# Patient Record
Sex: Female | Born: 1975 | Race: Black or African American | Hispanic: No | Marital: Single | Smoking: Former smoker
Health system: Southern US, Community
[De-identification: ages and names within clinical notes are randomized; demographics above are authoritative.]

## PROBLEM LIST (undated history)

## (undated) DIAGNOSIS — G4730 Sleep apnea, unspecified: Secondary | ICD-10-CM

## (undated) DIAGNOSIS — F909 Attention-deficit hyperactivity disorder, unspecified type: Secondary | ICD-10-CM

## (undated) DIAGNOSIS — F419 Anxiety disorder, unspecified: Secondary | ICD-10-CM

## (undated) DIAGNOSIS — R51 Headache: Secondary | ICD-10-CM

## (undated) DIAGNOSIS — R519 Headache, unspecified: Secondary | ICD-10-CM

## (undated) DIAGNOSIS — I499 Cardiac arrhythmia, unspecified: Secondary | ICD-10-CM

## (undated) DIAGNOSIS — Z9109 Other allergy status, other than to drugs and biological substances: Secondary | ICD-10-CM

## (undated) DIAGNOSIS — F32A Depression, unspecified: Secondary | ICD-10-CM

## (undated) DIAGNOSIS — G8929 Other chronic pain: Secondary | ICD-10-CM

## (undated) DIAGNOSIS — Z8614 Personal history of Methicillin resistant Staphylococcus aureus infection: Secondary | ICD-10-CM

## (undated) DIAGNOSIS — G5603 Carpal tunnel syndrome, bilateral upper limbs: Secondary | ICD-10-CM

## (undated) DIAGNOSIS — F329 Major depressive disorder, single episode, unspecified: Secondary | ICD-10-CM

## (undated) DIAGNOSIS — M654 Radial styloid tenosynovitis [de Quervain]: Secondary | ICD-10-CM

## (undated) HISTORY — PX: ABDOMINAL HYSTERECTOMY: SHX81

---

## 2006-09-01 ENCOUNTER — Emergency Department (HOSPITAL_COMMUNITY): Admission: EM | Admit: 2006-09-01 | Discharge: 2006-09-01 | Payer: Self-pay | Admitting: Emergency Medicine

## 2006-09-18 ENCOUNTER — Emergency Department (HOSPITAL_COMMUNITY): Admission: EM | Admit: 2006-09-18 | Discharge: 2006-09-18 | Payer: Self-pay | Admitting: Emergency Medicine

## 2006-10-02 ENCOUNTER — Ambulatory Visit: Payer: Self-pay | Admitting: Internal Medicine

## 2006-11-29 ENCOUNTER — Ambulatory Visit: Payer: Self-pay | Admitting: Hematology and Oncology

## 2006-11-29 ENCOUNTER — Inpatient Hospital Stay (HOSPITAL_COMMUNITY): Admission: EM | Admit: 2006-11-29 | Discharge: 2006-12-01 | Payer: Self-pay | Admitting: Emergency Medicine

## 2006-12-02 ENCOUNTER — Ambulatory Visit: Payer: Self-pay | Admitting: Hematology and Oncology

## 2006-12-16 LAB — CBC & DIFF AND RETIC
BASO%: 0.7 % (ref 0.0–2.0)
Eosinophils Absolute: 0.1 10*3/uL (ref 0.0–0.5)
LYMPH%: 20.1 % (ref 14.0–48.0)
MCH: 24.5 pg — ABNORMAL LOW (ref 26.0–34.0)
MCHC: 32 g/dL (ref 32.0–36.0)
RBC: 3.83 10*6/uL (ref 3.70–5.32)
RDW: 23.5 % — ABNORMAL HIGH (ref 11.3–14.5)
lymph#: 1 10*3/uL (ref 0.9–3.3)

## 2006-12-17 LAB — RETICULOCYTES (CHCC)
ABS Retic: 122.8 10*3/uL (ref 19.0–186.0)
RBC.: 3.96 MIL/uL (ref 3.87–5.11)

## 2006-12-29 LAB — PROTHROMBIN TIME
INR: 1.2 (ref 0.0–1.5)
Prothrombin Time: 15.4 seconds — ABNORMAL HIGH (ref 11.6–15.2)

## 2006-12-29 LAB — COMPREHENSIVE METABOLIC PANEL
AST: 12 U/L (ref 0–37)
Albumin: 4 g/dL (ref 3.5–5.2)
BUN: 9 mg/dL (ref 6–23)
CO2: 20 mEq/L (ref 19–32)
Calcium: 8.6 mg/dL (ref 8.4–10.5)
Chloride: 107 mEq/L (ref 96–112)
Creatinine, Ser: 0.65 mg/dL (ref 0.40–1.20)
Glucose, Bld: 99 mg/dL (ref 70–99)
Sodium: 140 mEq/L (ref 135–145)

## 2006-12-29 LAB — APTT: aPTT: 29 seconds (ref 24–37)

## 2006-12-29 LAB — FOLATE RBC: RBC Folate: 1104 ng/mL — ABNORMAL HIGH (ref 180–600)

## 2006-12-29 LAB — FERRITIN: Ferritin: 11 ng/mL (ref 10–291)

## 2006-12-29 LAB — DIRECT ANTIGLOBULIN TEST (NOT AT ARMC): DAT IgG: NEGATIVE

## 2006-12-29 LAB — VITAMIN B12: Vitamin B-12: 455 pg/mL (ref 211–911)

## 2006-12-29 LAB — IRON AND TIBC: UIBC: 439 ug/dL

## 2007-01-09 LAB — VON WILLEBRAND FACTOR MULTIMER: Von Willebrand Multimers: NORMAL

## 2007-01-19 ENCOUNTER — Ambulatory Visit: Payer: Self-pay | Admitting: Hematology and Oncology

## 2007-01-19 LAB — CBC WITH DIFFERENTIAL/PLATELET
Basophils Absolute: 0 10*3/uL (ref 0.0–0.1)
Eosinophils Absolute: 0.1 10*3/uL (ref 0.0–0.5)
HGB: 9.8 g/dL — ABNORMAL LOW (ref 11.6–15.9)
MCV: 73.6 fL — ABNORMAL LOW (ref 81.0–101.0)
MONO#: 0.3 10*3/uL (ref 0.1–0.9)
MONO%: 4.9 % (ref 0.0–13.0)
NEUT#: 3.8 10*3/uL (ref 1.5–6.5)
Platelets: 786 10*3/uL — ABNORMAL HIGH (ref 145–400)
RDW: 21.4 % — ABNORMAL HIGH (ref 11.3–14.5)
WBC: 5.3 10*3/uL (ref 3.9–10.0)

## 2007-01-19 LAB — BASIC METABOLIC PANEL
BUN: 8 mg/dL (ref 6–23)
CO2: 23 mEq/L (ref 19–32)
Glucose, Bld: 109 mg/dL — ABNORMAL HIGH (ref 70–99)
Potassium: 4.7 mEq/L (ref 3.5–5.3)

## 2007-01-19 LAB — IRON AND TIBC
%SAT: 3 % — ABNORMAL LOW (ref 20–55)
Iron: 14 ug/dL — ABNORMAL LOW (ref 42–145)

## 2007-01-19 LAB — FERRITIN: Ferritin: 9 ng/mL — ABNORMAL LOW (ref 10–291)

## 2007-02-05 DIAGNOSIS — Z8614 Personal history of Methicillin resistant Staphylococcus aureus infection: Secondary | ICD-10-CM

## 2007-02-05 HISTORY — DX: Personal history of Methicillin resistant Staphylococcus aureus infection: Z86.14

## 2007-03-05 ENCOUNTER — Ambulatory Visit: Payer: Self-pay | Admitting: Hematology and Oncology

## 2007-03-09 LAB — CBC WITH DIFFERENTIAL/PLATELET
Basophils Absolute: 0 10*3/uL (ref 0.0–0.1)
Eosinophils Absolute: 0.1 10*3/uL (ref 0.0–0.5)
MCH: 25.7 pg — ABNORMAL LOW (ref 26.0–34.0)
MCHC: 33.3 g/dL (ref 32.0–36.0)
MCV: 77.2 fL — ABNORMAL LOW (ref 81.0–101.0)
MONO#: 0.3 10*3/uL (ref 0.1–0.9)
MONO%: 8 % (ref 0.0–13.0)
NEUT#: 2.3 10*3/uL (ref 1.5–6.5)
NEUT%: 61.8 % (ref 39.6–76.8)
RDW: 31.3 % — ABNORMAL HIGH (ref 11.3–14.5)
WBC: 3.7 10*3/uL — ABNORMAL LOW (ref 3.9–10.0)
lymph#: 1.1 10*3/uL (ref 0.9–3.3)

## 2007-03-09 LAB — COMPREHENSIVE METABOLIC PANEL
Albumin: 3.8 g/dL (ref 3.5–5.2)
Alkaline Phosphatase: 27 U/L — ABNORMAL LOW (ref 39–117)
BUN: 8 mg/dL (ref 6–23)
CO2: 24 mEq/L (ref 19–32)
Glucose, Bld: 116 mg/dL — ABNORMAL HIGH (ref 70–99)
Potassium: 4.1 mEq/L (ref 3.5–5.3)

## 2007-03-09 LAB — IRON AND TIBC
%SAT: 27 % (ref 20–55)
Iron: 79 ug/dL (ref 42–145)
TIBC: 295 ug/dL (ref 250–470)
UIBC: 216 ug/dL

## 2007-05-01 ENCOUNTER — Emergency Department (HOSPITAL_COMMUNITY): Admission: EM | Admit: 2007-05-01 | Discharge: 2007-05-01 | Payer: Self-pay | Admitting: Emergency Medicine

## 2007-06-05 ENCOUNTER — Ambulatory Visit: Payer: Self-pay | Admitting: Hematology and Oncology

## 2007-06-09 LAB — CBC WITH DIFFERENTIAL/PLATELET
Basophils Absolute: 0 10*3/uL (ref 0.0–0.1)
EOS%: 1.8 % (ref 0.0–7.0)
Eosinophils Absolute: 0.1 10*3/uL (ref 0.0–0.5)
HCT: 40 % (ref 34.8–46.6)
HGB: 14.1 g/dL (ref 11.6–15.9)
LYMPH%: 32.2 % (ref 14.0–48.0)
NEUT#: 2.9 10*3/uL (ref 1.5–6.5)
NEUT%: 61.7 % (ref 39.6–76.8)

## 2007-09-15 ENCOUNTER — Emergency Department (HOSPITAL_COMMUNITY): Admission: EM | Admit: 2007-09-15 | Discharge: 2007-09-16 | Payer: Self-pay | Admitting: *Deleted

## 2007-10-05 ENCOUNTER — Emergency Department (HOSPITAL_COMMUNITY): Admission: EM | Admit: 2007-10-05 | Discharge: 2007-10-06 | Payer: Self-pay | Admitting: Emergency Medicine

## 2007-12-03 ENCOUNTER — Ambulatory Visit: Payer: Self-pay | Admitting: Hematology and Oncology

## 2008-02-27 ENCOUNTER — Emergency Department (HOSPITAL_COMMUNITY): Admission: EM | Admit: 2008-02-27 | Discharge: 2008-02-27 | Payer: Self-pay | Admitting: Emergency Medicine

## 2008-07-07 ENCOUNTER — Encounter: Admission: RE | Admit: 2008-07-07 | Discharge: 2008-07-07 | Payer: Self-pay | Admitting: Internal Medicine

## 2008-09-02 ENCOUNTER — Emergency Department (HOSPITAL_COMMUNITY): Admission: EM | Admit: 2008-09-02 | Discharge: 2008-09-02 | Payer: Self-pay | Admitting: Emergency Medicine

## 2010-05-13 LAB — DIFFERENTIAL
Basophils Relative: 0 % (ref 0–1)
Eosinophils Absolute: 0.1 10*3/uL (ref 0.0–0.7)
Eosinophils Relative: 2 % (ref 0–5)
Lymphocytes Relative: 28 % (ref 12–46)
Lymphs Abs: 1.6 10*3/uL (ref 0.7–4.0)
Neutro Abs: 3.7 10*3/uL (ref 1.7–7.7)

## 2010-05-13 LAB — CBC
HCT: 39.3 % (ref 36.0–46.0)
Hemoglobin: 13.2 g/dL (ref 12.0–15.0)
MCHC: 33.5 g/dL (ref 30.0–36.0)
Platelets: 152 10*3/uL (ref 150–400)
RBC: 4.53 MIL/uL (ref 3.87–5.11)
WBC: 5.7 10*3/uL (ref 4.0–10.5)

## 2010-06-19 NOTE — Consult Note (Signed)
NAMECATHRYN, [name]              ACCOUNT NO.:  [id number]   MEDICAL RECORD NO.:  [id number]          PATIENT TYPE:  INP   LOCATION:  1438                         FACILITY:  [facility]   PHYSICIAN:  Lauretta I. Odogwu, M.D.DATE OF BIRTH:  [birth year]   DATE OF CONSULTATION:  DATE OF DISCHARGE:                                 CONSULTATION   REASON FOR CONSULTATION:  Anemia and thrombocytosis.   FINDINGS:  The patient is a 35 year old woman who presented to the  emergency room at [facility], with a history of fatigue over a  several-day period.  She had initially been seen at an Urgent Care  Center, was notified that her hemoglobin was low and thus presented to  the emergency room to be evaluated.  The patient describes heavy menses  of 6 months' duration.  Her menses in the last 6 months had been every 2  weeks, lasting a week and with heavy flow.  She was seen by a  gynecologist in [facility] and was placed on birth control pills.  A  pelvic exam in May of 2008, was presumably within normal limits per  patient.  She was evaluated by a hematologist years ago for iron-  deficiency anemia secondary to menorrhagia.  She was placed on iron but  was not able to tolerate p.o. iron, and was subsequently received IV  iron in the form of Dextran.  She also reports receiving a bone marrow  biopsy a few years ago.  Reports are not available for review.  The  patient denies any other evidence of bleeding, specifically hematemesis,  hematuria, melena and hematochezia.  She denies abdominal pain and  weight loss.   PAST MEDICAL HISTORY:  1. History of iron-deficiency anemia.  2. History of bipolar disorder.   MEDICATIONS:  Depakote, Wellbutrin, Seasonale birth control pills.   ALLERGIES:  DARVOCET, PERCOCET.   SOCIAL HISTORY:  The patient is single.  She has four children.  She has  smoked half a pack of cigarettes a day, since she was 35 year old.  She  denies alcohol use.  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   FAMILY HISTORY:  Denies a family history of bleeding amd anemia.   REVIEW OF SYSTEMS:  As above. in addition:- Admits to shortness of  breath with mild exertion, admits to fatigue.  Denies significant weight  loss, fever or chills, nausea, vomiting or diarrhea.  She denies chest  pain, palpitations.  Rest of review of system unremarkable.   PHYSICAL EXAMINATION:  GENERAL:  The patient is alert and oriented x3.  VITALS:  Pulse 97, blood pressure on 112/61, temperature 98.2,  respirations 16, sats 96% on room air.  HEENT:  Head is atraumatic and normocephalic.  Extraocular muscles  intact.  Sclerae is anicteric.  Pupils equal, round and reactive to  light.  Mouth moist without ulcerations, thrush, lesions.  NECK:  Supple without thyromegaly.  CHEST:  Demonstrates good air entry bilaterally, clear to auscultation.  CARDIOVASCULAR:  Unremarkable.  ABDOMEN:  Soft, nontender.  Bowel sounds present.  EXTREMITIES:  Demonstrate no  edema.  Pulses present and symmetrical.  LYMPH NODES:  No no palpable cervical, axillary or inguinal adenopathy.  EXTREMITIES:  Demonstrate no edema.  Pulses present and symmetrical.  CNS AND PNS:  No focal deficit.   LABORATORY DATA:  White cell count 5.1, hemoglobin 3.6, hematocrit 12.4,  platelets 1,000,201, MCV 61.3.  Sodium 140, potassium 2.7, chloride 109, CO2 24, BUN 28, creatinine 0.6,  glucose 83.   Review of peripheral smear demonstrates a few giant platelets.  The red  blood cell were hypochromic and microcytic.  There were no shistocytes.   IMPRESSION AND PLAN:  A 35 year old woman presenting with:  Severe iron deficiency anemia with likely reactive thrombocytosis.  The  patient has been treated in the past with p.o. iron, which she was not  able to tolerate.  She subsequently required IV iron.  She also states  she has had a bone marrow biopsy.  She was seen by hematologist at [facility].  I will  need to obtain those records.  She is currently receiving  blood transfusion.  Once this is completed, I would suggest obtaining  iron studies to include ferritin, TIBC, iron, B12 with folic acid, retic  count, serum protein pheresis, LDH, a Von Willebrand panel, factor 8  activity level and hemoglobin electrophoresis.  She will also have an  abdominal ultrasound, to assess the size of liver and spleen.  I agree  with low-dose aspirin for the time being.  She needs to re-establish  follow up with her gynecologist. She will set up a follow-up visit with  hematology, when she is discharged.  Thank you for this consult.      Lauretta I. Odogwu, M.D.  Electronically Signed     LIO/MEDQ  D:  11/29/2006  T:  11/30/2006  Job:  [id number]   cc:   Silvestre Moment  Fax: [phone]

## 2010-06-19 NOTE — H&P (Signed)
[name], [name]              ACCOUNT NO.:  [id number]   MEDICAL RECORD NO.:  [id number]          PATIENT TYPE:  INP   LOCATION:  1438                         FACILITY:  [facility]   PHYSICIAN:  Della Goo, M.D. DATE OF BIRTH:  [birth year]   DATE OF ADMISSION:  11/29/2006  DATE OF DISCHARGE:                              HISTORY & PHYSICAL   CHIEF COMPLAINT:  Abnormal labs.   HISTORY OF PRESENT ILLNESS:  This is a 35 year old female who received a  call from her primary care physician to report to the emergency  department secondary to abnormal lab findings; a hemoglobin level of  3.7.  The patient reports having malaise, fatigue, weakness for months,  and having heavy menses that occur bimonthly.  She reports she has had  heavy menses for approximately 6 months.  She denies having any  hematemesis, hematochezia or melena.  The patient does report having  chronic headaches.   PAST MEDICAL HISTORY:  Significant for anemia, bipolar disorder,  thrombocytosis of which the patient had been seeing a hem/oncologist in  2002, and reports having a bone marrow biopsy performed at that time.  The patient reports seeing a Dr. Gypsy Lore in [place], [place].   MEDICATIONS:  1. Depakote 1000 mg 1 p.o. q day.  2. Wellbutrin 300 mg 1 p.o. day.   ALLERGIES:  DARVOCET AND PERCOCET, which both cause swelling.   SOCIAL HISTORY:  The patient is home with her 2 children.  Positive  tobacco history, half-a-pack per day.  Denies any alcohol usage or  illicit drug usage.   PAST SURGICAL HISTORY:  History of a C-section x1.   FAMILY HISTORY:  Mother with depression.  Father, no medical problems.  Positive hypertension and diabetes mellitus in her maternal grandmother.  Positive cancer in her paternal grandmother, who had lung and bone  cancer.   REVIEW OF SYSTEMS:  Pertinents are mentioned above.   PHYSICAL EXAMINATION:  This is a 35 year old well-nourished, well-  developed  female in no acute distress.  She is currently hemodynamically  stable.  Her temperature is 98.4, blood pressure 124/52, heart rate 105,  respirations 18, O2 saturations 100%.  HEENT EXAMINATION:  Normocephalic, atraumatic.  There is no scleral  icterus.  Pupils equally round, reactive to light.  Extraocular muscles  are intact.  Funduscopic benign.  Oropharynx is clear.  Mucosa moist.  There are no exudates.  NECK:  Supple.  Full range of motion.  No thyromegaly, adenopathy, or  jugular venous distention.  CARDIOVASCULAR:  Mild tachycardia.  No murmurs, gallops or rubs.  LUNGS:  Clear to auscultation bilaterally.  ABDOMEN:  Positive bowel sounds, soft, nontender, nondistended.  EXTREMITIES:  Without cyanosis, clubbing or edema.  NEUROLOGIC EXAMINATION:  Alert and oriented x3.  There are no focal  deficits.   LABORATORY STUDIES:  White blood cell count 5.1, hemoglobin 3.6,  hematocrit 12.4, platelets 1201, MCV 61.3.  Sodium 140, potassium 3.7,  chloride 109 bicarbonate 24, BUN 2, creatinine 0.60 and glucose 83.   ASSESSMENT:  35 year old female being admitted with:  1. Severe microcytic anemia.  2. Menorrhagia.  3. Myelodysplastic syndrome/thrombocytosis.  4. Tobacco abuse.  5. History of bipolar disorder.   PLAN:  The patient will be transfused packed red blood cells to increase  her hemoglobin.  She will continue on her regular medications.  Her COAG  studies will be checked.  The medical records will be requested from Dr.  Gypsy Lore, hem/oncologist in [place], [place], and [facility] records.  GI prophylaxis has been ordered.  TED hose  has been ordered for DVT prophylaxis, and a nicotine patch has been  ordered.      Della Goo, M.D.  Electronically Signed     HJ/MEDQ  D:  11/29/2006  T:  12/01/2006  Job:  [id number]   cc:   Lacretia Leigh. Quintella Reichert, M.D.  [address]  [place]  [place] [address]

## 2010-06-19 NOTE — Discharge Summary (Signed)
[name], [name]              ACCOUNT NO.:  [id number]   MEDICAL RECORD NO.:  [id number]          PATIENT TYPE:  INP   LOCATION:  1438                         FACILITY:  [facility]   PHYSICIAN:  Isidor Holts, M.D.  DATE OF BIRTH:  17-Nov-1975   DATE OF ADMISSION:  11/29/2006  DATE OF DISCHARGE:  12/01/2006                               DISCHARGE SUMMARY   DISCHARGE DIAGNOSES:  1..  Anemia secondary to acute on chronic blood  loss.  1. Menorrhagia/polymenorrhea causing #1 above.  2. Reactive thrombocytosis.  3. History of bipolar disorder.  4. Smoking history.   DISCHARGE MEDICATIONS:  1. Depakote 1000 mg p.o. daily.  2. Wellbutrin XL 300 mg p.o. daily.  3. Nu-Iron 150 mg p.o. b.i.d.   PROCEDURES:  Abdominal ultrasound scan dated December 01, 2006.  This  showed 10 x 7 x 10-cm cyst in the lower left abdomen, appears to be  arising from the left ovary.  Mild hepatosplenomegaly.   CONSULTATIONS:  Lauretta I. Odogwu, M.D. hematologist.   ADMISSION HISTORY:  As in H&P notes of November 29, 2006, dictated by Dr.  Della Goo.  However, in brief, this is a 35 year old female, with  known history of bipolar disorder, chronic anemia/thrombocytosis, also  heavy and frequent menstruation reportedly occurring twice monthly, who  presents with a hemoglobin of 3.7 following months of increasing fatigue  and weakness.  She was admitted for further evaluation, investigation  and management.   CLINICAL COURSE:  Problem 1.  PROFOUND IRON DEFICIENCY ANEMIA:  For details of  presentation, refer to admission history above.  The patient presents  with profound iron-deficiency anemia.  Repeat hemoglobin/hematocrit was  3.6 and 12.4, respectively, with an MCV of 61.3.  Iron studies showed  the following findings:  Serum iron 16, TIBC 429, percentage saturation  438.  These findings were consistent with profound iron-deficiency  anemia against a background of known history of  polymenorrhea/menorrhagia.  The patient was transfused a total of 4  units PRBC, resulting in a bump in hemoglobin to 7.7 and hematocrit of  23.5.  She was commenced on iron supplementation.  Although it was felt  that she might benefit from an additional 2 units of PRBC, the patient  declined this, on December 01, 2006.   Problem 2.  REACTIVE THROMBOCYTOSIS:  At the time of initial  presentation, the patient's platelet count was found to markedly  elevated at 1201.  Hematology consultation was called, which was  provided by Dr. Arlan Organ.  The patient was evaluated as  recommended; however, by November 30, 2006, the platelet count had  dropped to 811, consistent with possible reactive phenomenon.  Thrombocytosis will be watched, however, and follow-up will be indicated  for profound iron deficiency, as it may be likely that the patient will  require parenteral iron infusion in due course.  Dr. Dalene Carrow has kindly  agreed to have the patient follow up with her on an outpatient basis, in  the hematology clinic.   Problem 3.  MENORRHAGIA/POLYMENORRHEA:  The patient has been troubled by  this months and currently sees a gynecologist, Dr.  Alyce Pagan, at [facility], [address] [place].  She has been recommended to follow  up with her primary gynecologist on discharge.  Reportedly, hormonal  treatment is planned.   Problem 4.  BIPOLAR DISORDER:  The patient's mood was stable throughout  the course of this hospitalization.  She continues on pre-admission  Wellbutrin and Depakote.   Problem 5.  SMOKING HISTORY:  The patient was counseled appropriately.   DISPOSITION:  The patient was deemed sufficiently clinically recovered  and stable to be discharged on December 01, 2006.  She may return to  regular duties on December 04, 2006.   ACTIVITY:  As tolerated.   DIET:  No restrictions.   FOLLOW-UP INSTRUCTIONS:  The patient is recommended to follow up with  her primary  gynecologist, Dr. Alyce Pagan, within 1-2 weeks of discharge.  She has been instructed to call for an appointment.  She is also to  follow up with Dr. Arlan Organ of hematology, telephone number [phone], at a date to be determined.      Isidor Holts, M.D.  Electronically Signed     CO/MEDQ  D:  12/01/2006  T:  12/01/2006  Job:  [id number]   cc:   Vicente Serene I. Odogwu, M.D.  Fax: [phone]

## 2010-08-05 ENCOUNTER — Emergency Department (HOSPITAL_BASED_OUTPATIENT_CLINIC_OR_DEPARTMENT_OTHER)
Admission: EM | Admit: 2010-08-05 | Discharge: 2010-08-05 | Disposition: A | Discharge status: Home / Self Care | Payer: Medicaid Other | Attending: Emergency Medicine | Admitting: Emergency Medicine

## 2010-08-05 DIAGNOSIS — M25519 Pain in unspecified shoulder: Secondary | ICD-10-CM | POA: Insufficient documentation

## 2010-08-05 DIAGNOSIS — F319 Bipolar disorder, unspecified: Secondary | ICD-10-CM | POA: Insufficient documentation

## 2010-11-02 LAB — POCT PREGNANCY, URINE: Preg Test, Ur: NEGATIVE

## 2010-11-07 LAB — CBC
MCHC: 33.2
MCV: 87.3
RBC: 5.04
RDW: 14.2

## 2010-11-07 LAB — POCT I-STAT, CHEM 8
Calcium, Ion: 1.11 — ABNORMAL LOW
Glucose, Bld: 110 — ABNORMAL HIGH
HCT: 45
Hemoglobin: 15.3 — ABNORMAL HIGH
Potassium: 3.6

## 2010-11-07 LAB — DIFFERENTIAL
Basophils Relative: 0
Eosinophils Absolute: 0
Monocytes Absolute: 0.5
Monocytes Relative: 4
Neutrophils Relative %: 87 — ABNORMAL HIGH

## 2010-11-07 LAB — STREP A DNA PROBE

## 2010-11-14 LAB — CBC
HCT: 24.2 — ABNORMAL LOW
Hemoglobin: 7.7 — CL
Hemoglobin: 7.9 — CL
MCHC: 28.9 — ABNORMAL LOW
MCHC: 32.5
MCHC: 32.6
MCV: 73.8 — ABNORMAL LOW
RBC: 3.19 — ABNORMAL LOW
RBC: 3.28 — ABNORMAL LOW
RDW: 32.1 — ABNORMAL HIGH
RDW: 32.2 — ABNORMAL HIGH
RDW: 32.6 — ABNORMAL HIGH

## 2010-11-14 LAB — BASIC METABOLIC PANEL
BUN: 2 — ABNORMAL LOW
CO2: 25
CO2: 25
Calcium: 8.5
Chloride: 109
Creatinine, Ser: 0.58
Creatinine, Ser: 0.6
GFR calc Af Amer: >60
GFR calc non Af Amer: >60
Glucose, Bld: 104 — ABNORMAL HIGH
Glucose, Bld: 91
Potassium: 3.7
Sodium: 139
Sodium: 141

## 2010-11-14 LAB — PROTEIN ELECTROPH W RFLX QUANT IMMUNOGLOBULINS
Albumin ELP: 55.9
Alpha-1-Globulin: 5.5 — ABNORMAL HIGH
Alpha-2-Globulin: 10.1
Beta 2: 4.2
Beta Globulin: 8 — ABNORMAL HIGH
Gamma Globulin: 16.3

## 2010-11-14 LAB — IRON AND TIBC
Iron: 16 — ABNORMAL LOW
UIBC: 413

## 2010-11-14 LAB — RETICULOCYTES
RBC.: 3.48 — ABNORMAL LOW
Retic Count, Absolute: 27.8
Retic Ct Pct: 0.8

## 2010-11-14 LAB — CROSSMATCH: ABO/RH(D): AB POS

## 2010-11-14 LAB — FERRITIN: Ferritin: 8 — ABNORMAL LOW (ref 10–291)

## 2010-11-14 LAB — DIFFERENTIAL
Eosinophils Absolute: 0.2
Monocytes Absolute: 0.5
Neutrophils Relative %: 58

## 2010-11-14 LAB — VON WILLEBRAND FACTOR MULTIMER
Von Willebrand Ag: 168 % normal — ABNORMAL HIGH (ref 61–164)
Von Willebrand Multimers: NORMAL

## 2010-11-14 LAB — CARDIAC PANEL(CRET KIN+CKTOT+MB+TROPI)
CK, MB: 0.2 — ABNORMAL LOW
CK, MB: 0.2 — ABNORMAL LOW
Relative Index: INVALID
Troponin I: 0.01
Troponin I: <0.01

## 2010-11-14 LAB — HEMOGLOBINOPATHY EVALUATION
Hgb A: 97.6 %
Hgb F Quant: 0 (ref 0.0–2.0)
Hgb S Quant: 0 % (ref 0.0–0.0)

## 2010-11-14 LAB — PATHOLOGIST SMEAR REVIEW

## 2010-11-14 LAB — PREPARE FRESH FROZEN PLASMA

## 2010-11-14 LAB — FOLATE RBC: RBC Folate: 962 — ABNORMAL HIGH

## 2010-11-14 LAB — PREPARE RBC (CROSSMATCH)

## 2010-11-14 LAB — ABO/RH: ABO/RH(D): AB POS

## 2010-11-15 ENCOUNTER — Emergency Department (HOSPITAL_BASED_OUTPATIENT_CLINIC_OR_DEPARTMENT_OTHER)
Admission: EM | Admit: 2010-11-15 | Discharge: 2010-11-15 | Disposition: A | Discharge status: Home / Self Care | Payer: Medicaid Other | Attending: Emergency Medicine | Admitting: Emergency Medicine

## 2010-11-15 ENCOUNTER — Encounter: Payer: Self-pay | Admitting: *Deleted

## 2010-11-15 DIAGNOSIS — S0501XA Injury of conjunctiva and corneal abrasion without foreign body, right eye, initial encounter: Secondary | ICD-10-CM

## 2010-11-15 DIAGNOSIS — G8929 Other chronic pain: Secondary | ICD-10-CM | POA: Insufficient documentation

## 2010-11-15 DIAGNOSIS — S058X9A Other injuries of unspecified eye and orbit, initial encounter: Secondary | ICD-10-CM | POA: Insufficient documentation

## 2010-11-15 DIAGNOSIS — X58XXXA Exposure to other specified factors, initial encounter: Secondary | ICD-10-CM | POA: Insufficient documentation

## 2010-11-15 DIAGNOSIS — H5710 Ocular pain, unspecified eye: Secondary | ICD-10-CM | POA: Insufficient documentation

## 2010-11-15 HISTORY — DX: Other chronic pain: G89.29

## 2010-11-15 MED ORDER — ERYTHROMYCIN 5 MG/GM OP OINT: TOPICAL_OINTMENT | Freq: Four times a day (QID) | OPHTHALMIC | Status: AC

## 2010-11-15 MED ORDER — BACITRACIN 500 UNIT/GM EX OINT
1.0000 "application " | TOPICAL_OINTMENT | Freq: Once | CUTANEOUS | Status: DC
  Filled 2010-11-15: qty 0.9

## 2010-11-15 MED ORDER — FLUORESCEIN SODIUM 1 MG OP STRP
ORAL_STRIP | OPHTHALMIC | Status: AC
  Filled 2010-11-15: qty 1

## 2010-11-15 MED ORDER — TETRACAINE HCL 0.5 % OP SOLN
OPHTHALMIC | Status: AC
  Filled 2010-11-15: qty 2

## 2010-11-15 MED ORDER — ACETAMINOPHEN 325 MG PO TABS: 650.0000 mg | ORAL_TABLET | Freq: Once | ORAL | Status: DC

## 2010-11-15 MED ORDER — TETRACAINE HCL 0.5 % OP SOLN
2.0000 [drp] | Freq: Once | OPHTHALMIC | Status: AC
  Administered 2010-11-15: 2 [drp] via OPHTHALMIC

## 2010-11-15 MED ORDER — ERYTHROMYCIN 5 MG/GM OP OINT
TOPICAL_OINTMENT | Freq: Once | OPHTHALMIC | Status: AC
  Administered 2010-11-15: 1 via OPHTHALMIC
  Filled 2010-11-15: qty 3.5

## 2010-11-15 MED ORDER — TETANUS-DIPHTH-ACELL PERTUSSIS 5-2.5-18.5 LF-MCG/0.5 IM SUSP
0.5000 mL | Freq: Once | INTRAMUSCULAR | Status: AC
  Administered 2010-11-15: 0.5 mL via INTRAMUSCULAR
  Filled 2010-11-15: qty 0.5

## 2010-11-15 MED ORDER — FLUORESCEIN SODIUM 1 MG OP STRP
1.0000 | ORAL_STRIP | Freq: Once | OPHTHALMIC | Status: AC
  Administered 2010-11-15: 1 via OPHTHALMIC

## 2010-11-15 NOTE — ED Notes (Signed)
D/c home with rx x 1 for erythromycin ointment

## 2010-11-15 NOTE — ED Notes (Signed)
Woke with the feeling like she had trash in her right eye. Blurry and light sensitive. No known exposure.

## 2010-11-16 NOTE — ED Provider Notes (Signed)
History     CSN: [id number] Arrival date & time: 11/15/2010  7:46 PM  Chief Complaint  Patient presents with  . Eye Pain    (Consider location/radiation/quality/duration/timing/severity/associated sxs/prior treatment) HPI Patient presents today complaining of 10 on 10 right eye pain. There is no radiation of this pain. Patient says that she woke up this morning feeling like she had "trash" in her right eye. She's not a contact lens wearer. She had no history of trauma with this. Patient has no sick contacts with conjunctivitis. She does feel like something is stuck in her eye. Keeping light out of her eyes seems to help her pain there is nothing that makes it better. There are no other associated or modifying factors.  Past Medical History  Diagnosis Date  . Chronic pain     Past Surgical History  Procedure Date  . Cesarean section   . Abdominal hysterectomy     History reviewed. No pertinent family history.  History  Substance Use Topics  . Smoking status: Not on file  . Smokeless tobacco: Not on file  . Alcohol Use:     OB History    Grav Para Term Preterm Abortions TAB SAB Ect Mult Living                  Review of Systems  Constitutional: Negative.   HENT: Negative.   Eyes: Positive for photophobia, pain, discharge, redness and itching.  Respiratory: Negative.   Cardiovascular: Negative.   Gastrointestinal: Negative.   Genitourinary: Negative.   Musculoskeletal: Negative.   Neurological: Negative.   Hematological: Negative.   Psychiatric/Behavioral: Negative.   All other systems reviewed and are negative.    Allergies  Darvocet and Percocet  Home Medications   Current Outpatient Rx  Name Route Sig Dispense Refill  . CYCLOBENZAPRINE HCL 10 MG PO TABS Oral Take 10 mg by mouth 2 (two) times daily as needed. For arm pain     . HYDROCODONE-IBUPROFEN 7.5-200 MG PO TABS Oral Take 1 tablet by mouth daily as needed. For pain     . PRESCRIPTION MEDICATION  Topical Apply 1 application topically daily. Steroid cream     . ERYTHROMYCIN 5 MG/GM OP OINT Right Eye Place into the right eye every 6 (six) hours. 3.5 g 0    BP 123/76  Pulse 90  Temp(Src) 98.9 F (37.2 C) (Oral)  Resp 16  SpO2 99%  Physical Exam  Nursing note and vitals reviewed. Constitutional: She is oriented to person, place, and time. She appears well-developed and well-nourished. No distress.  HENT:  Head: Normocephalic and atraumatic.  Eyes: EOM are normal. Pupils are equal, round, and reactive to light. Right eye exhibits no discharge. Left eye exhibits no discharge.       Patient had evidence of corneal abrasion at the 7:00 position on the legs examination of the right eye. This was done for seen. Patient had scleral injection in the right eye.   Neck: Normal range of motion. Neck supple.  Musculoskeletal: Normal range of motion.  Neurological: She is alert and oriented to person, place, and time. No cranial nerve deficit. She exhibits normal muscle tone. Coordination normal.  Skin: Skin is warm and dry. No rash noted.  Psychiatric: She has a normal mood and affect.    ED Course  Procedures (including critical care time)  Labs Reviewed - No data to display No results found.   1. Corneal abrasion, right       MDM  Patient  was evaluated and had evidence of a corneal abrasion. She was given erythromycin ointment for this she was told to use this 4 times a day for the next 5 days. Patient to followup with eye doctor she's not have complete resolution of her symptoms. Patient stated understanding of the plan and was discharged home in good condition.        Cyndra Numbers, MD 11/16/10 [phone]

## 2011-03-08 DIAGNOSIS — I499 Cardiac arrhythmia, unspecified: Secondary | ICD-10-CM

## 2011-03-08 HISTORY — DX: Cardiac arrhythmia, unspecified: I49.9

## 2011-03-16 ENCOUNTER — Emergency Department (HOSPITAL_BASED_OUTPATIENT_CLINIC_OR_DEPARTMENT_OTHER)
Admission: EM | Admit: 2011-03-16 | Discharge: 2011-03-16 | Disposition: A | Discharge status: Home / Self Care | Payer: Medicaid Other | Attending: Emergency Medicine | Admitting: Emergency Medicine

## 2011-03-16 ENCOUNTER — Encounter (HOSPITAL_BASED_OUTPATIENT_CLINIC_OR_DEPARTMENT_OTHER): Payer: Self-pay | Admitting: *Deleted

## 2011-03-16 DIAGNOSIS — J029 Acute pharyngitis, unspecified: Secondary | ICD-10-CM | POA: Insufficient documentation

## 2011-03-16 MED ORDER — HYDROCODONE-ACETAMINOPHEN 7.5-500 MG/15ML PO SOLN: 10.0000 mL | ORAL | Status: AC | PRN

## 2011-03-16 MED ORDER — DEXAMETHASONE 1 MG/ML PO CONC
10.0000 mg | Freq: Once | ORAL | Status: AC
  Administered 2011-03-16: 10 mg via ORAL
  Filled 2011-03-16: qty 1

## 2011-03-16 MED ORDER — DEXAMETHASONE 1 MG/ML PO CONC
ORAL | Status: AC
  Filled 2011-03-16: qty 9

## 2011-03-16 NOTE — ED Provider Notes (Signed)
History    Scribed for Hanley Seamen, MD, the patient was seen in room [facility]. This chart was scribed by Katha Cabal.   CSN: [id number]  Arrival date & time 03/16/11  1955   First MD Initiated Contact with Patient 03/16/11 2329      Chief Complaint  Patient presents with  . Sore Throat    (Consider location/radiation/quality/duration/timing/severity/associated sxs/prior treatment) Patient is a 36 y.o. female presenting with pharyngitis. The history is provided by the patient. No language interpreter was used.  Sore Throat This is a new problem. The current episode started yesterday. The problem occurs constantly. The problem has not changed since onset.Associated symptoms include headaches. The symptoms are aggravated by swallowing. The symptoms are relieved by nothing.   Patient reports headache and cough.  Patient adds that is has been difficult for her to eat food.   Past Medical History  Diagnosis Date  . Chronic pain     Past Surgical History  Procedure Date  . Cesarean section   . Abdominal hysterectomy     History reviewed. No pertinent family history.  History  Substance Use Topics  . Smoking status: Current Everyday Smoker  . Smokeless tobacco: Not on file  . Alcohol Use: No    OB History    Grav Para Term Preterm Abortions TAB SAB Ect Mult Living                  Review of Systems  Constitutional: Negative for fever.  Respiratory: Positive for cough.   Neurological: Positive for headaches.  All other systems reviewed and are negative.    Allergies  Darvocet and Percocet  Home Medications   Current Outpatient Rx  Name Route Sig Dispense Refill  . HYDROCODONE-IBUPROFEN 7.5-200 MG PO TABS Oral Take 1 tablet by mouth every 6 (six) hours as needed. For pain    . PRESCRIPTION MEDICATION Topical Apply 1 application topically 2 (two) times daily. Steroid cream to prevent boils    . HYDROCODONE-ACETAMINOPHEN 7.5-500 MG/15ML PO SOLN Oral Take 10  mLs by mouth every 4 (four) hours as needed for pain or cough. 200 mL 0    BP 134/85  Pulse 92  Temp(Src) 98.7 F (37.1 C) (Oral)  Resp 18  Ht 5\' 4"  (1.626 m)  Wt 217 lb (98.431 kg)  BMI 37.25 kg/m2  SpO2 100%  Physical Exam General: Well-developed, well-nourished female in no acute distress; appearance consistent with age of record HENT: normocephalic, atraumatic, tonsils enlarged without exudate, mild pharngeal erythema, uvula midline no trismus, patient with "hot potato voice" Eyes: pupils equal round and reactive to light; extraocular muscles intact Neck: supple, tender lymphnodes  Heart: regular rate and rhythm; no murmurs, rubs or gallops Lungs: clear to auscultation bilaterally Abdomen: soft; nondistended; nontender; no masses or hepatosplenomegaly; bowel sounds present Extremities: No deformity; full range of motion; pulses normal Neurologic: Awake, alert and oriented; motor function intact in all extremities and symmetric; no facial droop Skin: Warm and dry Psychiatric: Normal mood and affect   ED Course  Procedures (including critical care time)   DIAGNOSTIC STUDIES: Oxygen Saturation is 100% on room air, normal by my interpretation.     COORDINATION OF CARE: 11:34 PM  Physical exam complete.  Advised patient of negative rapid strep.      LABS / RADIOLOGY:    Labs Reviewed  RAPID STREP SCREEN   No results found.       MDM  I personally performed the services described in this  documentation, which was scribed in my presence.  The recorded information has been reviewed and considered.         MEDICATIONS GIVEN IN THE E.D. Scheduled Meds:    . dexamethasone  10 mg Oral Once   Continuous Infusions:      IMPRESSION: 1. Viral pharyngitis                 Hanley Seamen, MD 03/17/11 [phone]

## 2011-03-16 NOTE — ED Notes (Signed)
Pt states sh has a hx of sinus infections and feels like stuff is draining down the back of her throat.

## 2011-04-05 DIAGNOSIS — M654 Radial styloid tenosynovitis [de Quervain]: Secondary | ICD-10-CM

## 2011-04-05 DIAGNOSIS — G5603 Carpal tunnel syndrome, bilateral upper limbs: Secondary | ICD-10-CM

## 2011-04-05 HISTORY — DX: Carpal tunnel syndrome, bilateral upper limbs: G56.03

## 2011-04-05 HISTORY — DX: Radial styloid tenosynovitis (de quervain): M65.4

## 2011-04-15 ENCOUNTER — Ambulatory Visit (HOSPITAL_BASED_OUTPATIENT_CLINIC_OR_DEPARTMENT_OTHER): Payer: Medicaid Other

## 2011-04-23 ENCOUNTER — Encounter (HOSPITAL_BASED_OUTPATIENT_CLINIC_OR_DEPARTMENT_OTHER): Payer: Self-pay | Admitting: *Deleted

## 2011-04-23 NOTE — Pre-Procedure Instructions (Signed)
Echo, stress test, EKG, office note req. from Dr. Verl Dicker office

## 2011-04-24 ENCOUNTER — Other Ambulatory Visit: Payer: Self-pay | Admitting: Orthopedic Surgery

## 2011-04-30 ENCOUNTER — Ambulatory Visit (HOSPITAL_BASED_OUTPATIENT_CLINIC_OR_DEPARTMENT_OTHER)
Admission: RE | Admit: 2011-04-30 | Discharge: 2011-04-30 | Disposition: A | Discharge status: Home / Self Care | Payer: Medicaid Other | Source: Ambulatory Visit | Attending: Orthopedic Surgery | Admitting: Orthopedic Surgery

## 2011-04-30 ENCOUNTER — Encounter (HOSPITAL_BASED_OUTPATIENT_CLINIC_OR_DEPARTMENT_OTHER)
Admission: RE | Disposition: A | Discharge status: Home / Self Care | Payer: Self-pay | Source: Ambulatory Visit | Attending: Orthopedic Surgery

## 2011-04-30 ENCOUNTER — Encounter (HOSPITAL_BASED_OUTPATIENT_CLINIC_OR_DEPARTMENT_OTHER): Payer: Self-pay | Admitting: *Deleted

## 2011-04-30 ENCOUNTER — Encounter (HOSPITAL_BASED_OUTPATIENT_CLINIC_OR_DEPARTMENT_OTHER): Payer: Self-pay | Admitting: Certified Registered Nurse Anesthetist

## 2011-04-30 ENCOUNTER — Ambulatory Visit (HOSPITAL_BASED_OUTPATIENT_CLINIC_OR_DEPARTMENT_OTHER): Payer: Medicaid Other | Admitting: Certified Registered Nurse Anesthetist

## 2011-04-30 DIAGNOSIS — G4730 Sleep apnea, unspecified: Secondary | ICD-10-CM | POA: Insufficient documentation

## 2011-04-30 DIAGNOSIS — M654 Radial styloid tenosynovitis [de Quervain]: Secondary | ICD-10-CM | POA: Insufficient documentation

## 2011-04-30 DIAGNOSIS — G5600 Carpal tunnel syndrome, unspecified upper limb: Secondary | ICD-10-CM | POA: Insufficient documentation

## 2011-04-30 DIAGNOSIS — M6740 Ganglion, unspecified site: Secondary | ICD-10-CM | POA: Insufficient documentation

## 2011-04-30 DIAGNOSIS — M6749 Ganglion, multiple sites: Secondary | ICD-10-CM | POA: Insufficient documentation

## 2011-04-30 HISTORY — DX: Radial styloid tenosynovitis (de quervain): M65.4

## 2011-04-30 HISTORY — DX: Other allergy status, other than to drugs and biological substances: Z91.09

## 2011-04-30 HISTORY — PX: DORSAL COMPARTMENT RELEASE: SHX5039

## 2011-04-30 HISTORY — PX: CARPAL TUNNEL RELEASE: SHX101

## 2011-04-30 HISTORY — DX: Personal history of Methicillin resistant Staphylococcus aureus infection: Z86.14

## 2011-04-30 HISTORY — DX: Sleep apnea, unspecified: G47.30

## 2011-04-30 HISTORY — DX: Carpal tunnel syndrome, bilateral upper limbs: G56.03

## 2011-04-30 HISTORY — DX: Cardiac arrhythmia, unspecified: I49.9

## 2011-04-30 LAB — POCT HEMOGLOBIN-HEMACUE: Hemoglobin: 13.8 g/dL (ref 12.0–15.0)

## 2011-04-30 SURGERY — CARPAL TUNNEL RELEASE
Anesthesia: General | Site: Wrist | Laterality: Left | Wound class: Clean

## 2011-04-30 MED ORDER — DROPERIDOL 2.5 MG/ML IJ SOLN
INTRAMUSCULAR | Status: DC | PRN
  Administered 2011-04-30: 0.625 mg via INTRAVENOUS

## 2011-04-30 MED ORDER — HYDROCODONE-ACETAMINOPHEN 5-325 MG PO TABS: ORAL_TABLET | ORAL | Status: AC

## 2011-04-30 MED ORDER — FENTANYL CITRATE 0.05 MG/ML IJ SOLN: 25.0000 ug | INTRAMUSCULAR | Status: DC | PRN

## 2011-04-30 MED ORDER — LIDOCAINE HCL (CARDIAC) 20 MG/ML IV SOLN
INTRAVENOUS | Status: DC | PRN
  Administered 2011-04-30: 60 mg via INTRAVENOUS

## 2011-04-30 MED ORDER — ONDANSETRON HCL 4 MG/2ML IJ SOLN
INTRAMUSCULAR | Status: DC | PRN
  Administered 2011-04-30: 4 mg via INTRAVENOUS

## 2011-04-30 MED ORDER — METOCLOPRAMIDE HCL 5 MG/ML IJ SOLN: 10.0000 mg | Freq: Once | INTRAMUSCULAR | Status: DC | PRN

## 2011-04-30 MED ORDER — MIDAZOLAM HCL 5 MG/5ML IJ SOLN
INTRAMUSCULAR | Status: DC | PRN
  Administered 2011-04-30: 2 mg via INTRAVENOUS

## 2011-04-30 MED ORDER — FENTANYL CITRATE 0.05 MG/ML IJ SOLN
INTRAMUSCULAR | Status: DC | PRN
  Administered 2011-04-30 (×2): 50 ug via INTRAVENOUS
  Administered 2011-04-30: 25 ug via INTRAVENOUS

## 2011-04-30 MED ORDER — CHLORHEXIDINE GLUCONATE 4 % EX LIQD: 60.0000 mL | Freq: Once | CUTANEOUS | Status: DC

## 2011-04-30 MED ORDER — LACTATED RINGERS IV SOLN
INTRAVENOUS | Status: DC
  Administered 2011-04-30 (×2): via INTRAVENOUS

## 2011-04-30 MED ORDER — MORPHINE SULFATE 2 MG/ML IJ SOLN: 0.0500 mg/kg | INTRAMUSCULAR | Status: DC | PRN

## 2011-04-30 MED ORDER — LIDOCAINE HCL 2 % IJ SOLN
INTRAMUSCULAR | Status: DC | PRN
  Administered 2011-04-30: 5 mL

## 2011-04-30 MED ORDER — DEXAMETHASONE SODIUM PHOSPHATE 10 MG/ML IJ SOLN
INTRAMUSCULAR | Status: DC | PRN
  Administered 2011-04-30: 10 mg via INTRAVENOUS

## 2011-04-30 MED ORDER — PROPOFOL 10 MG/ML IV EMUL
INTRAVENOUS | Status: DC | PRN
  Administered 2011-04-30: 200 mg via INTRAVENOUS

## 2011-04-30 MED ORDER — HYDROCODONE-ACETAMINOPHEN 5-325 MG PO TABS
1.0000 | ORAL_TABLET | ORAL | Status: DC | PRN
  Administered 2011-04-30: 1 via ORAL

## 2011-04-30 SURGICAL SUPPLY — 46 items
BANDAGE ADHESIVE 1X3 (GAUZE/BANDAGES/DRESSINGS) ×2 IMPLANT
BANDAGE ELASTIC 3 VELCRO ST LF (GAUZE/BANDAGES/DRESSINGS) ×2 IMPLANT
BLADE MINI RND TIP GREEN BEAV (BLADE) IMPLANT
BLADE SURG 15 STRL LF DISP TIS (BLADE) ×1 IMPLANT
BLADE SURG 15 STRL SS (BLADE) ×1
BNDG ESMARK 4X9 LF (GAUZE/BANDAGES/DRESSINGS) ×2 IMPLANT
BRUSH SCRUB EZ PLAIN DRY (MISCELLANEOUS) ×2 IMPLANT
CLOTH BEACON ORANGE TIMEOUT ST (SAFETY) ×2 IMPLANT
CORDS BIPOLAR (ELECTRODE) ×2 IMPLANT
COVER MAYO STAND STRL (DRAPES) ×2 IMPLANT
COVER TABLE BACK 60X90 (DRAPES) ×2 IMPLANT
CUFF TOURNIQUET SINGLE 18IN (TOURNIQUET CUFF) ×2 IMPLANT
DECANTER SPIKE VIAL GLASS SM (MISCELLANEOUS) IMPLANT
DRAPE EXTREMITY T 121X128X90 (DRAPE) ×2 IMPLANT
DRAPE SURG 17X23 STRL (DRAPES) ×2 IMPLANT
DRSG TEGADERM 4X4.75 (GAUZE/BANDAGES/DRESSINGS) IMPLANT
GLOVE BIO SURGEON STRL SZ 6.5 (GLOVE) ×2 IMPLANT
GLOVE BIO SURGEON STRL SZ7 (GLOVE) ×2 IMPLANT
GLOVE BIOGEL M STRL SZ7.5 (GLOVE) ×2 IMPLANT
GLOVE BIOGEL PI IND STRL 7.0 (GLOVE) ×1 IMPLANT
GLOVE BIOGEL PI INDICATOR 7.0 (GLOVE) ×1
GLOVE EXAM NITRILE PF MED BLUE (GLOVE) ×2 IMPLANT
GLOVE ORTHO TXT STRL SZ7.5 (GLOVE) ×2 IMPLANT
GOWN BRE IMP PREV XXLGXLNG (GOWN DISPOSABLE) ×4 IMPLANT
GOWN PREVENTION PLUS XLARGE (GOWN DISPOSABLE) ×2 IMPLANT
GOWN PREVENTION PLUS XXLARGE (GOWN DISPOSABLE) IMPLANT
NEEDLE 27GAX1X1/2 (NEEDLE) ×2 IMPLANT
PACK BASIN DAY SURGERY FS (CUSTOM PROCEDURE TRAY) ×2 IMPLANT
PAD CAST 3X4 CTTN HI CHSV (CAST SUPPLIES) ×1 IMPLANT
PADDING CAST ABS 4INX4YD NS (CAST SUPPLIES)
PADDING CAST ABS COTTON 4X4 ST (CAST SUPPLIES) IMPLANT
PADDING CAST COTTON 3X4 STRL (CAST SUPPLIES) ×1
SLEEVE SCD COMPRESS KNEE MED (MISCELLANEOUS) ×2 IMPLANT
SPLINT PLASTER CAST XFAST 3X15 (CAST SUPPLIES) ×5 IMPLANT
SPLINT PLASTER XTRA FASTSET 3X (CAST SUPPLIES) ×5
SPONGE GAUZE 4X4 12PLY (GAUZE/BANDAGES/DRESSINGS) ×2 IMPLANT
STOCKINETTE 4X48 STRL (DRAPES) ×2 IMPLANT
STRIP CLOSURE SKIN 1/2X4 (GAUZE/BANDAGES/DRESSINGS) ×2 IMPLANT
SUT PROLENE 3 0 PS 2 (SUTURE) ×2 IMPLANT
SUT VIC AB 4-0 P-3 18XBRD (SUTURE) IMPLANT
SUT VIC AB 4-0 P3 18 (SUTURE)
SYR 3ML 23GX1 SAFETY (SYRINGE) IMPLANT
SYR CONTROL 10ML LL (SYRINGE) ×2 IMPLANT
TRAY DSU PREP LF (CUSTOM PROCEDURE TRAY) ×2 IMPLANT
UNDERPAD 30X30 INCONTINENT (UNDERPADS AND DIAPERS) ×2 IMPLANT
WATER STERILE IRR 1000ML POUR (IV SOLUTION) IMPLANT

## 2011-04-30 NOTE — H&P (Signed)
  [name] is an 36 y.o. female.   Chief Complaint: Complaining of constant and persistent pain, radial aspect, left wrist HPI: Patient is a 36 year old right-hand-dominant female who presented to our office for dilation and treatment of chronic and persistent pain radial aspect of left wrist. No history of trauma. She has tried conservative treatments without success. We feel that she needs to undergo surgical intervention for this problem.  Past Medical History  Diagnosis Date  . Chronic pain     shoulders, arms, hands  . Irregular heartbeat 03/2011    determined to be palpitations, per Dr Verl Dicker office note  . Environmental allergies   . De Quervain's tenosynovitis, left 04/2011  . Carpal tunnel syndrome on both sides 04/2011  . Hx MRSA infection 2009  . Sleep apnea     denies sx., but is to have sleep study 05/2011    Past Surgical History  Procedure Date  . Cesarean section     x 3  . Abdominal hysterectomy     partial    History reviewed. No pertinent family history. Social History:  reports that she has been smoking Cigarettes.  She has smoked for the past 10 years. She has never used smokeless tobacco. She reports that she does not drink alcohol or use illicit drugs.  Allergies:  Allergies  Allergen Reactions  . Adhesive (Tape) Itching and Other (See Comments)    SKIN IRRITATION  . Darvocet (Propoxyphene N-Acetaminophen) Itching, Swelling and Rash  . Percocet (Oxycodone-Acetaminophen) Itching, Swelling and Rash  . Tramadol Itching, Swelling and Rash    No current facility-administered medications on file as of 04/30/2011.   Medications Prior to Admission  Medication Sig Dispense Refill  . fluticasone (VERAMYST) 27.5 MCG/SPRAY nasal spray Place 2 sprays into the nose daily. AM        No results found for this or any previous visit (from the past 48 hour(s)).  No results found.   Pertinent items are noted in HPI.  Height 5\' 1"  (1.549 m), weight 97.07 kg  (214 lb).  General appearance: alert Head: Normocephalic, without obvious abnormality Neck: supple, symmetrical, trachea midline Resp: clear to auscultation bilaterally Cardio: regular rate and rhythm, S1, S2 normal, no murmur, click, rub or gallop GI: soft, non-tender; bowel sounds normal; no masses,  no organomegaly Extremities examination of her left wrist reveals a positive Finkelstein's test. Neurovascular she is intact. She is excellent motion of her fingers without triggering. Pulses: 2+ and symmetric Skin: normal Neurologic: Grossly normal   Assessment/Plan Impression: DeQuervain's stenosing tenosynovitis  , left wrist.  Plan: Release left first dorsal compartment. The procedure risks, benefits, and postoperative course were discussed with the patient at length and she was in agreement with this plan.  DASNOIT,Kilah Drahos J 04/30/2011, 7:20 AM    H&P documentation: 04/30/2011  -History and Physical Reviewed  -Patient has been re-examined  -No change in the plan of care  Wyn Forster, MD

## 2011-04-30 NOTE — Anesthesia Procedure Notes (Signed)
Procedure Name: LMA Insertion Date/Time: 04/30/2011 8:49 AM Performed by: Baila Rouse D Pre-anesthesia Checklist: Patient identified, Emergency Drugs available, Suction available and Patient being monitored Patient Re-evaluated:Patient Re-evaluated prior to inductionOxygen Delivery Method: Circle System Utilized Preoxygenation: Pre-oxygenation with 100% oxygen Intubation Type: IV induction Ventilation: Mask ventilation without difficulty LMA: LMA inserted LMA Size: 4.0 Number of attempts: 1 Placement Confirmation: positive ETCO2 Tube secured with: Tape Dental Injury: Teeth and Oropharynx as per pre-operative assessment

## 2011-04-30 NOTE — Anesthesia Postprocedure Evaluation (Signed)
Anesthesia Post Note  Patient: [name]  Procedure(s) Performed: Procedure(s) (LRB): CARPAL TUNNEL RELEASE (Left) RELEASE DORSAL COMPARTMENT (DEQUERVAIN) (Left)  Anesthesia type: MAC  Patient location: PACU  Post pain: Pain level controlled  Post assessment: Patient's Cardiovascular Status Stable  Last Vitals:  Filed Vitals:   04/30/11 0925  BP: 120/53  Pulse: 96  Temp: 36.6 C  Resp: 18    Post vital signs: Reviewed and stable  Level of consciousness: alert  Complications: No apparent anesthesia complications

## 2011-04-30 NOTE — Op Note (Signed)
[id number] 

## 2011-04-30 NOTE — Brief Op Note (Addendum)
04/30/2011  9:14 AM  PATIENT:  [name]  36 y.o. female  PRE-OPERATIVE DIAGNOSIS:  left carpal tunnel syndrome, left dequervains  POST-OPERATIVE DIAGNOSIS:  left carpal tunnel syndrome, left dequervains  PROCEDURE:   CARPAL TUNNEL RELEASE (Left) RELEASE DORSAL COMPARTMENT (DEQUERVAIN) (Left) EXCISION OF RETINACULAR MYXOID CYST (Left)  SURGEON:  Wyn Forster., MD   PHYSICIAN ASSISTANT:   ASSISTANTS: Mallory Shirk.A-C   ANESTHESIA:   IV sedation  EBL:  Total I/O In: 1000 [I.V.:1000] Out: -   BLOOD ADMINISTERED:none  DRAINS: none   LOCAL MEDICATIONS USED:  XYLOCAINE   SPECIMEN:  No Specimen  DISPOSITION OF SPECIMEN:  N/A  COUNTS:  YES  TOURNIQUET:  * Missing tourniquet times found for documented tourniquets in log:  [id number] *  DICTATION: .Other Dictation: Dictation Number [id number]  PLAN OF CARE: Discharge to home after PACU  PATIENT DISPOSITION:  PACU - hemodynamically stable.

## 2011-04-30 NOTE — Discharge Instructions (Signed)
[facility] Instructions Hand Surgery  Wound Care: Keep your hand elevated above the level of your heart.  Do not allow it to dangle  by your side.  Keep the dressing dry and do not remove it unless your doctor advises you to do so.  He will usually change it at the time of your post-op visit.  Moving your fingers is advised to stimulate circulation but will depend on the site of your surgery.  If you have a splint applied, your doctor will advise you regarding movement.  Activity: Do not drive or operate machinery today.  Rest today and then you may return to your normal activity and work as indicated by your physician.  Diet:  Drink liquids today or eat a light diet.  You may resume a regular diet tomorrow.    General expectations: Pain for two to three days. Fingers may become slightly swollen.  Call your doctor if any of the following occur: Severe pain not relieved by pain medication. Elevated temperature. Dressing soaked with blood. Inability to move fingers. White or bluish color to fingers.[facility]  [address] [place], [place] [address] [phone]   Post Anesthesia Home Care Instructions  Activity: Get plenty of rest for the remainder of the day. A responsible adult should stay with you for 24 hours following the procedure.  For the next 24 hours, DO NOT: -Drive a car -Operate machinery -Drink alcoholic beverages -Take any medication unless instructed by your physician -Make any legal decisions or sign important papers.  Meals: Start with liquid foods such as gelatin or soup. Progress to regular foods as tolerated. Avoid greasy, spicy, heavy foods. If nausea and/or vomiting occur, drink only clear liquids until the nausea and/or vomiting subsides. Call your physician if vomiting continues.  Special Instructions/Symptoms: Your throat may feel dry or sore from the anesthesia or the breathing tube placed in your throat during surgery. If  this causes discomfort, gargle with warm salt water. The discomfort should disappear within 24 hours.   

## 2011-04-30 NOTE — Transfer of Care (Signed)
Immediate Anesthesia Transfer of Care Note  Patient: [name]  Procedure(s) Performed: Procedure(s) (LRB): CARPAL TUNNEL RELEASE (Left) RELEASE DORSAL COMPARTMENT (DEQUERVAIN) (Left)  Patient Location: PACU  Anesthesia Type: General  Level of Consciousness: awake and sedated  Airway & Oxygen Therapy: Patient Spontanous Breathing and Patient connected to face mask oxygen  Post-op Assessment: Report given to PACU RN and Post -op Vital signs reviewed and stable  Post vital signs: Reviewed and stable  Complications: No apparent anesthesia complications

## 2011-04-30 NOTE — Anesthesia Preprocedure Evaluation (Signed)
Anesthesia Evaluation  Patient identified by MRN, date of birth, ID band Patient awake    Reviewed: Allergy & Precautions, H&P , NPO status , Patient's Chart, lab work & pertinent test results, reviewed documented beta blocker date and time   Airway Mallampati: II TM Distance: >3 FB Neck ROM: full    Dental   Pulmonary sleep apnea ,          Cardiovascular negative cardio ROS      Neuro/Psych  Neuromuscular disease negative psych ROS   GI/Hepatic negative GI ROS, Neg liver ROS,   Endo/Other  Morbid obesity  Renal/GU negative Renal ROS  negative genitourinary   Musculoskeletal   Abdominal   Peds  Hematology negative hematology ROS (+)   Anesthesia Other Findings See surgeon's H&P   Reproductive/Obstetrics negative OB ROS                           Anesthesia Physical Anesthesia Plan  ASA: III  Anesthesia Plan: General   Post-op Pain Management:    Induction: Intravenous  Airway Management Planned: LMA  Additional Equipment:   Intra-op Plan:   Post-operative Plan: Extubation in OR  Informed Consent: I have reviewed the patients History and Physical, chart, labs and discussed the procedure including the risks, benefits and alternatives for the proposed anesthesia with the patient or authorized representative who has indicated his/her understanding and acceptance.     Plan Discussed with: CRNA and Surgeon  Anesthesia Plan Comments:         Anesthesia Quick Evaluation

## 2011-05-01 NOTE — Op Note (Signed)
NAME:  KIERRA, [name]                   ACCOUNT NO.:  MEDICAL RECORD NO.:  [id number]  LOCATION:                                 FACILITY:  PHYSICIAN:  Katy Fitch. Salote Weidmann, M.D. 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  OPERATION: 1. Release of left transcarpal ligament. 2. Release of left 1st dorsal compartment with excision of ganglion     cyst from the 1st dorsal compartment retinaculum.  OPERATING SURGEON:  Katy Fitch. Mashayla Lavin, M.D.  ASSISTANT:  Jonni Sanger, P.A.  ANESTHESIA:  General by LMA.  SUPERVISING ANESTHESIOLOGIST:  Janetta Hora. Gelene Mink, M.D.  INDICATIONS:  Marley Pakula is a 36 year old woman referred by Dr. Cloyd Stagers- Bonsu for evaluation and management of hand numbness bilaterally and a painful swelling at her left 1st dorsal compartment.  Clinical examination revealed signs of bilateral carpal tunnel syndrome confirmed by electrodiagnostic studies.  She also had a painful Finkelstein maneuver and signs of stenosing tenosynovitis of left 1st dorsal compartment.  She had a 4 mm diameter ganglion cyst that is formed at the apex of her 1st dorsal compartment extensor retinaculum.  After informed consent, she was brought to the operating room at this time.  PROCEDURE:  Lillyth Gallen was brought to room 2 of the [facility] and placed in supine position on the operating table.  Following induction of general anesthesia by LMA technique, left arm was prepped with Betadine soap and solution, sterilely draped.  A pneumatic tourniquet was applied proximal left brachium.  Following exsanguination of left arm with Esmarch bandage, arterial tourniquet was inflated to 220 mmHg.  Following routine surgical  time- out, procedure commenced with a short incision in the line of the ring finger and the palm.  Subcutaneous tissues were carefully divided on the palmar fascia.  This split longitudinally to the Thompson's branch of median nerve.  These were followed back to the transcarpal ligament which was gently isolated the median nerve.  Ligament was then released along its ulnar border extending into the distal forearm.  This widely opened carpal canal.  No masses or pigments were noted.  The wound was then repaired with intradermal 3-0 Prolene.  Attention was then directed to the 1st dorsal compartment region.  A short transverse incision was fashioned directly over the palpable cyst.  Subcutaneous tissues were carefully divided taking care to gently retract the cephalic vein and the radial superficial sensory branches.  The compartment was isolated with 4 Ragnell retractors.  A microronguer was used to debride a 4 mm in diameter myxoid cyst.  The compartment was then split with scalpel and scissors.  There were 2 slips of the abductor pollicis longus.  The extensor pollicis brevis was in a separate dorsal compartment.  This was separately released in the septum between the 2 compartments excised with a microronguer.  Thereafter, free range of motion of the wrist and thumb was recovered. The wound was then repaired with intradermal 3-0  Prolene with Steri- Strips.  2% lidocaine was infiltrated for postop analgesia followed by application of a volar plaster splint maintaining the wrist in 10 degrees of dorsiflexion.  For aftercare, Ms. Oros was provided a prescription for Vicodin 5 mg 1 p.o. q.4-6 h. p.r.n. pain, 20 tablets without refill.     Katy Fitch Sarabeth Benton, M.D.     RVS/MEDQ  D:  04/30/2011  T:  04/30/2011  Job:  [id number]  cc:   Jackie Plum, M.D.

## 2011-05-02 ENCOUNTER — Encounter (HOSPITAL_BASED_OUTPATIENT_CLINIC_OR_DEPARTMENT_OTHER): Payer: Self-pay | Admitting: Orthopedic Surgery

## 2011-05-08 ENCOUNTER — Ambulatory Visit (HOSPITAL_BASED_OUTPATIENT_CLINIC_OR_DEPARTMENT_OTHER): Payer: Medicaid Other | Attending: Cardiology | Admitting: Radiology

## 2011-05-08 VITALS — Ht 61.0 in | Wt 215.0 lb

## 2011-05-08 DIAGNOSIS — I4949 Other premature depolarization: Secondary | ICD-10-CM | POA: Insufficient documentation

## 2011-05-08 DIAGNOSIS — G4733 Obstructive sleep apnea (adult) (pediatric): Secondary | ICD-10-CM

## 2011-05-08 DIAGNOSIS — G4710 Hypersomnia, unspecified: Secondary | ICD-10-CM | POA: Insufficient documentation

## 2011-05-18 DIAGNOSIS — G4730 Sleep apnea, unspecified: Secondary | ICD-10-CM

## 2011-05-18 DIAGNOSIS — G4710 Hypersomnia, unspecified: Secondary | ICD-10-CM

## 2011-05-18 DIAGNOSIS — I4949 Other premature depolarization: Secondary | ICD-10-CM

## 2011-05-18 NOTE — Procedures (Signed)
[name], [name]              ACCOUNT NO.:  [id number]  MEDICAL RECORD NO.:  [id number]          PATIENT TYPE:  OUT  LOCATION:  SLEEP CENTER                 FACILITY[facility]  [facility]  PHYSICIAN:  Val Farnam D. Maple Hudson, MD, FCCP, FACPDATE OF BIRTH:  09/18/77  DATE OF STUDY:  05/08/2011                           NOCTURNAL POLYSOMNOGRAM  REFERRING PHYSICIAN:  Pamella Pert, MD  REFERRING PHYSICIAN:  Pamella Pert, MD  INDICATION FOR STUDY:  Hypersomnia with sleep apnea.  EPWORTH SLEEPINESS SCORE:  9/24.  BMI 40.6, weight 215 pounds, height 61 inches, neck 15 inches.  MEDICATIONS:  Home medications are charted and reviewed.  SLEEP ARCHITECTURE:  Total sleep time 335.5 minutes with sleep efficiency of 82.6%.  Stage I was 1.9%, stage II 77.6%, stage III absent, REM 20.4% of total sleep time.  Sleep latency 64.5 minutes, REM latency 57.5 minutes, awake after sleep onset 4 minutes, arousal index 15.6.  Bedtime medication:  None.  RESPIRATORY DATA:  Apnea-hypopnea index (AHI) 2.9 per hour.  A total of 16 events were scored, all as hypopneas.  Non-positional events more frequently recorded non-supine.  REM AHI 1.8 per hour.  There were insufficient numbers of events to qualify for split protocol CPAP titration on this study night.  OXYGEN DATA:  Mild to moderate snoring with oxygen desaturation to a nadir of 91% and mean oxygen saturation through the study of 94.9% on room air.  CARDIAC DATA:  Sinus rhythm with frequent PVCs.  MOVEMENT-PARASOMNIA:  No significant movement disturbance.  No bathroom trips.  IMPRESSIONS-RECOMMENDATIONS: 1. Occasional respiratory event with sleep disturbance, within normal     limits. Apnea/hypopnea index 2.9 per hour (the normal range for     adults is from 0-5 events per hour).  Events were seen in all sleep     positions.  REM AHI 1.8 per hour.  Mild to moderate snoring with     oxygen desaturation to a nadir of 91% and mean oxygen  saturation     through the study of 94.9% on room air. 2. She did not qualify for split protocol continuous positive airway     pressure titration on this study night. 3. Frequent premature ventricular contractions.     Aubrianne Molyneux D. Maple Hudson, MD, [facility], FACP Diplomate, American Board of Sleep Medicine    CDY/MEDQ  D:  05/18/2011 09:02:38  T:  05/18/2011 09:14:04  Job:  [id number]

## 2012-01-21 ENCOUNTER — Emergency Department (HOSPITAL_BASED_OUTPATIENT_CLINIC_OR_DEPARTMENT_OTHER): Payer: Medicaid Other

## 2012-01-21 ENCOUNTER — Emergency Department (HOSPITAL_BASED_OUTPATIENT_CLINIC_OR_DEPARTMENT_OTHER)
Admission: EM | Admit: 2012-01-21 | Discharge: 2012-01-21 | Disposition: A | Discharge status: Home / Self Care | Payer: Medicaid Other | Attending: Emergency Medicine | Admitting: Emergency Medicine

## 2012-01-21 ENCOUNTER — Encounter (HOSPITAL_BASED_OUTPATIENT_CLINIC_OR_DEPARTMENT_OTHER): Payer: Self-pay

## 2012-01-21 DIAGNOSIS — Z8614 Personal history of Methicillin resistant Staphylococcus aureus infection: Secondary | ICD-10-CM | POA: Insufficient documentation

## 2012-01-21 DIAGNOSIS — Z8669 Personal history of other diseases of the nervous system and sense organs: Secondary | ICD-10-CM | POA: Insufficient documentation

## 2012-01-21 DIAGNOSIS — Z8739 Personal history of other diseases of the musculoskeletal system and connective tissue: Secondary | ICD-10-CM | POA: Insufficient documentation

## 2012-01-21 DIAGNOSIS — R5383 Other fatigue: Secondary | ICD-10-CM | POA: Insufficient documentation

## 2012-01-21 DIAGNOSIS — R110 Nausea: Secondary | ICD-10-CM | POA: Insufficient documentation

## 2012-01-21 DIAGNOSIS — Z8679 Personal history of other diseases of the circulatory system: Secondary | ICD-10-CM | POA: Insufficient documentation

## 2012-01-21 DIAGNOSIS — R5381 Other malaise: Secondary | ICD-10-CM | POA: Insufficient documentation

## 2012-01-21 DIAGNOSIS — R531 Weakness: Secondary | ICD-10-CM

## 2012-01-21 DIAGNOSIS — F17200 Nicotine dependence, unspecified, uncomplicated: Secondary | ICD-10-CM | POA: Insufficient documentation

## 2012-01-21 DIAGNOSIS — Z8659 Personal history of other mental and behavioral disorders: Secondary | ICD-10-CM | POA: Insufficient documentation

## 2012-01-21 HISTORY — DX: Major depressive disorder, single episode, unspecified: F32.9

## 2012-01-21 HISTORY — DX: Depression, unspecified: F32.A

## 2012-01-21 LAB — COMPREHENSIVE METABOLIC PANEL
AST: 15 U/L (ref 0–37)
Albumin: 3.9 g/dL (ref 3.5–5.2)
Calcium: 9.3 mg/dL (ref 8.4–10.5)
Creatinine, Ser: 0.6 mg/dL (ref 0.50–1.10)

## 2012-01-21 LAB — CBC WITH DIFFERENTIAL/PLATELET
Basophils Absolute: 0 10*3/uL (ref 0.0–0.1)
Basophils Relative: 0 % (ref 0–1)
Eosinophils Relative: 3 % (ref 0–5)
HCT: 37.9 % (ref 36.0–46.0)
MCHC: 34.6 g/dL (ref 30.0–36.0)
MCV: 83.3 fL (ref 78.0–100.0)
Monocytes Absolute: 0.3 10*3/uL (ref 0.1–1.0)
RDW: 13.9 % (ref 11.5–15.5)

## 2012-01-21 LAB — URINALYSIS, ROUTINE W REFLEX MICROSCOPIC
Glucose, UA: NEGATIVE mg/dL
Hgb urine dipstick: NEGATIVE
Specific Gravity, Urine: 1.015 (ref 1.005–1.030)
Urobilinogen, UA: 1 mg/dL (ref 0.0–1.0)

## 2012-01-21 MED ORDER — SODIUM CHLORIDE 0.9 % IV BOLUS (SEPSIS)
1000.0000 mL | Freq: Once | INTRAVENOUS | Status: AC
  Administered 2012-01-21: 1000 mL via INTRAVENOUS

## 2012-01-21 NOTE — ED Notes (Signed)
C/o weakness to both legs "like they're getting ready to give way"-pt with steady gait to triage-NAD

## 2012-01-21 NOTE — ED Provider Notes (Signed)
History     CSN: [id number]  Arrival date & time 01/21/12  9147   First MD Initiated Contact with Patient 01/21/12 1920      Chief Complaint  Patient presents with  . Weakness    (Consider location/radiation/quality/duration/timing/severity/associated sxs/prior treatment) Patient is a 36 y.o. female presenting with weakness. The history is provided by the patient.  Weakness The primary symptoms include nausea. Primary symptoms do not include fever or vomiting. Primary symptoms comment: states her knees feel like they are going to give out on her Episode onset: 4 episodes in the last 3 weeks with her knees.  some episodes associated with pain but not today.  weakness developed this afternoon. The symptoms are unchanged. The neurological symptoms are diffuse.  Additional symptoms include weakness and anxiety. Additional symptoms do not include pain, leg pain or loss of balance. Medical issues do not include alcohol use or recent surgery.    Past Medical History  Diagnosis Date  . Chronic pain     shoulders, arms, hands  . Irregular heartbeat 03/2011    determined to be palpitations, per Dr Verl Dicker office note  . Environmental allergies   . De Quervain's tenosynovitis, left 04/2011  . Carpal tunnel syndrome on both sides 04/2011  . Hx MRSA infection 2009  . Sleep apnea     denies sx., but is to have sleep study 05/2011  . Depression     Past Surgical History  Procedure Date  . Cesarean section     x 3  . Abdominal hysterectomy     partial  . Carpal tunnel release 04/30/2011    Procedure: CARPAL TUNNEL RELEASE;  Surgeon: Wyn Forster., MD;  Location: [facility];  Service: Orthopedics;  Laterality: Left;  . Dorsal compartment release 04/30/2011    Procedure: RELEASE DORSAL COMPARTMENT (DEQUERVAIN);  Surgeon: Wyn Forster., MD;  Location: [facility];  Service: Orthopedics;  Laterality: Left;    No family history on file.  History   Substance Use Topics  . Smoking status: Current Every Day Smoker -- 10 years    Types: Cigarettes  . Smokeless tobacco: Never Used     Comment: 6-7 cig./day  . Alcohol Use: No    OB History    Grav Para Term Preterm Abortions TAB SAB Ect Mult Living                  Review of Systems  Constitutional: Negative for fever.  Respiratory: Negative for cough, shortness of breath and wheezing.   Cardiovascular: Negative for palpitations and leg swelling.  Gastrointestinal: Positive for nausea. Negative for vomiting, abdominal pain and diarrhea.  Neurological: Positive for weakness. Negative for loss of balance.  All other systems reviewed and are negative.    Allergies  Adhesive; Darvocet; Percocet; and Tramadol  Home Medications   Current Outpatient Rx  Name  Route  Sig  Dispense  Refill  . FLUTICASONE FUROATE 27.5 MCG/SPRAY NA SUSP   Nasal   Place 2 sprays into the nose daily. AM           BP 137/97  Pulse 92  Temp 98.7 F (37.1 C) (Oral)  Resp 16  Ht 5\' 1"  (1.549 m)  Wt 203 lb (92.08 kg)  BMI 38.36 kg/m2  SpO2 100%  Physical Exam  Nursing note and vitals reviewed. Constitutional: She is oriented to person, place, and time. She appears well-developed and well-nourished. No distress.  HENT:  Head: Normocephalic and  atraumatic.  Mouth/Throat: Oropharynx is clear and moist.  Eyes: Conjunctivae normal and EOM are normal. Pupils are equal, round, and reactive to light.  Neck: Normal range of motion. Neck supple.  Cardiovascular: Normal rate, regular rhythm and intact distal pulses.   No murmur heard. Pulmonary/Chest: Effort normal and breath sounds normal. No respiratory distress. She has no wheezes. She has no rales.  Abdominal: Soft. She exhibits no distension. There is no tenderness. There is no rebound and no guarding.  Musculoskeletal: Normal range of motion. She exhibits no edema and no tenderness.       Right knee: She exhibits effusion. She exhibits  normal range of motion and no swelling. no tenderness found.       Left knee: Normal.       Mild popping with ROM of the right knee  Neurological: She is alert and oriented to person, place, and time.  Skin: Skin is warm and dry. No rash noted. No erythema. No pallor.  Psychiatric: She has a normal mood and affect. Her behavior is normal.    ED Course  Procedures (including critical care time)  Labs Reviewed  CBC WITH DIFFERENTIAL - Abnormal; Notable for the following:    Neutrophils Relative 42 (*)     Lymphocytes Relative 48 (*)     All other components within normal limits  COMPREHENSIVE METABOLIC PANEL - Abnormal; Notable for the following:    Total Bilirubin 0.2 (*)     All other components within normal limits  URINALYSIS, ROUTINE W REFLEX MICROSCOPIC   Dg Knee Complete 4 Views Left  01/21/2012  *RADIOLOGY REPORT*  Clinical Data: Bilateral knee weakness  LEFT KNEE - COMPLETE 4+ VIEW  Comparison: 07/07/2008  Findings: No fracture or dislocation is seen.  The joint spaces are preserved.  The visualized soft tissues are unremarkable.  No definite suprapatellar knee joint effusion.  IMPRESSION: No acute osseous abnormality is seen.   Original Report Authenticated By: Charline Bills, M.D.    Dg Knee Complete 4 Views Right  01/21/2012  *RADIOLOGY REPORT*  Clinical Data: Bilateral knee weakness  RIGHT KNEE - COMPLETE 4+ VIEW  Comparison: None.  Findings: No fracture or dislocation is seen.  The joint spaces are preserved.  The visualized soft tissues are unremarkable.  No definite suprapatellar knee joint effusion.  IMPRESSION: No acute osseous abnormality is seen.   Original Report Authenticated By: Charline Bills, M.D.      No diagnosis found.    MDM   Patient is here complaining of bilateral leg weakness where her knees feel like they're getting out. Also she states she's been lightheaded and not feeling herself since earlier today. She denies pain, fever, vomiting or  diarrhea.  Vital signs are within normal limits. On exam patient has no abdominal pain, no pain with range of motion of the knees but some popping with bending the knees. She is neurovascularly intact and heart and lung sounds normal. Patient with a history of thrombocytopenia and anemia from severe vaginal bleeding Blood transfusion. She states that was in 2009 and since has had partial hysterectomy. She denies any recent medication changes. She states in the past she's been diagnosed with rheumatoid arthritis but she does not currently seek treatment for this. Do to feeling like her legs are giving out Will do bilateral plain films of the knees to evaluate for severe arthritis or other joint abnormality. Patient also had a CBC, CMP and UA to evaluate for a urinary tract infection that could be  causing her symptoms versus anemia. Patient given 1 L of IV fluid.  8:29 PM Labs and plain films wnl.  Will give PCP f/u.      Gwyneth Sprout, MD 01/21/12 2029

## 2012-01-21 NOTE — ED Notes (Signed)
MD at bedside. 

## 2013-01-26 ENCOUNTER — Emergency Department (HOSPITAL_BASED_OUTPATIENT_CLINIC_OR_DEPARTMENT_OTHER)
Admission: EM | Admit: 2013-01-26 | Discharge: 2013-01-26 | Disposition: A | Discharge status: Home / Self Care | Payer: Medicaid Other | Attending: Emergency Medicine | Admitting: Emergency Medicine

## 2013-01-26 ENCOUNTER — Encounter (HOSPITAL_BASED_OUTPATIENT_CLINIC_OR_DEPARTMENT_OTHER): Payer: Self-pay | Admitting: Emergency Medicine

## 2013-01-26 DIAGNOSIS — F17200 Nicotine dependence, unspecified, uncomplicated: Secondary | ICD-10-CM | POA: Insufficient documentation

## 2013-01-26 DIAGNOSIS — M792 Neuralgia and neuritis, unspecified: Secondary | ICD-10-CM

## 2013-01-26 DIAGNOSIS — G8929 Other chronic pain: Secondary | ICD-10-CM | POA: Insufficient documentation

## 2013-01-26 DIAGNOSIS — Z8614 Personal history of Methicillin resistant Staphylococcus aureus infection: Secondary | ICD-10-CM | POA: Insufficient documentation

## 2013-01-26 DIAGNOSIS — F3289 Other specified depressive episodes: Secondary | ICD-10-CM | POA: Insufficient documentation

## 2013-01-26 DIAGNOSIS — IMO0002 Reserved for concepts with insufficient information to code with codable children: Secondary | ICD-10-CM | POA: Insufficient documentation

## 2013-01-26 DIAGNOSIS — F909 Attention-deficit hyperactivity disorder, unspecified type: Secondary | ICD-10-CM | POA: Insufficient documentation

## 2013-01-26 DIAGNOSIS — Z79899 Other long term (current) drug therapy: Secondary | ICD-10-CM | POA: Insufficient documentation

## 2013-01-26 DIAGNOSIS — Z792 Long term (current) use of antibiotics: Secondary | ICD-10-CM | POA: Insufficient documentation

## 2013-01-26 DIAGNOSIS — Z8679 Personal history of other diseases of the circulatory system: Secondary | ICD-10-CM | POA: Insufficient documentation

## 2013-01-26 DIAGNOSIS — R209 Unspecified disturbances of skin sensation: Secondary | ICD-10-CM | POA: Insufficient documentation

## 2013-01-26 DIAGNOSIS — Z791 Long term (current) use of non-steroidal anti-inflammatories (NSAID): Secondary | ICD-10-CM | POA: Insufficient documentation

## 2013-01-26 DIAGNOSIS — F329 Major depressive disorder, single episode, unspecified: Secondary | ICD-10-CM | POA: Insufficient documentation

## 2013-01-26 HISTORY — DX: Attention-deficit hyperactivity disorder, unspecified type: F90.9

## 2013-01-26 MED ORDER — DOXYCYCLINE HYCLATE 100 MG PO CAPS: 100.0000 mg | ORAL_CAPSULE | Freq: Two times a day (BID) | ORAL | Status: DC

## 2013-01-26 MED ORDER — HYDROCODONE-ACETAMINOPHEN 5-325 MG PO TABS: 2.0000 | ORAL_TABLET | ORAL | Status: DC | PRN

## 2013-01-26 NOTE — ED Provider Notes (Signed)
CSN: [id number]     Arrival date & time 01/26/13  9811 History   First MD Initiated Contact with Patient 01/26/13 1002     Chief Complaint  Patient presents with  . Arm Pain   (Consider location/radiation/quality/duration/timing/severity/associated sxs/prior Treatment) HPI Comments: Patient presents with pain to her left arm. She states it started yesterday and has gotten worse today. She describes as a burning pain it starts in her left armpit area and radiates down her arm to her thumb. She denies any increase with movement of the arm. She denies any swelling of the hand. She does have a history of hidradenitis or pertinent and feels like there's a little bit of an abscess starting in her left armpit. She denies any pain in the shoulder joint itself. She denies any known injuries. She denies any chest pain or shortness of breath.  Patient is a 37 y.o. female presenting with arm pain.  Arm Pain Pertinent negatives include no chest pain, no headaches and no shortness of breath.    Past Medical History  Diagnosis Date  . Chronic pain     shoulders, arms, hands  . Irregular heartbeat 03/2011    determined to be palpitations, per Dr Verl Dicker office note  . Environmental allergies   . De Quervain's tenosynovitis, left 04/2011  . Carpal tunnel syndrome on both sides 04/2011  . Hx MRSA infection 2009  . Sleep apnea     denies sx., but is to have sleep study 05/2011  . Depression   . Depression   . ADHD (attention deficit hyperactivity disorder)    Past Surgical History  Procedure Laterality Date  . Cesarean section      x 3  . Abdominal hysterectomy      partial  . Carpal tunnel release  04/30/2011    Procedure: CARPAL TUNNEL RELEASE;  Surgeon: Wyn Forster., MD;  Location: [facility];  Service: Orthopedics;  Laterality: Left;  . Dorsal compartment release  04/30/2011    Procedure: RELEASE DORSAL COMPARTMENT (DEQUERVAIN);  Surgeon: Wyn Forster., MD;  Location:  [facility];  Service: Orthopedics;  Laterality: Left;   No family history on file. History  Substance Use Topics  . Smoking status: Current Every Day Smoker -- 10 years    Types: Cigarettes  . Smokeless tobacco: Never Used     Comment: 6-7 cig./day  . Alcohol Use: No   OB History   Grav Para Term Preterm Abortions TAB SAB Ect Mult Living                 Review of Systems  Constitutional: Negative for fever.  Respiratory: Negative for cough, chest tightness and shortness of breath.   Cardiovascular: Negative for chest pain.  Gastrointestinal: Negative for nausea and vomiting.  Musculoskeletal: Positive for arthralgias. Negative for neck pain.  Skin: Negative for rash and wound.  Neurological: Positive for numbness. Negative for dizziness, weakness and headaches.    Allergies  Adhesive; Darvocet; Percocet; and Tramadol  Home Medications   Current Outpatient Rx  Name  Route  Sig  Dispense  Refill  . atomoxetine (STRATTERA) 60 MG capsule   Oral   Take 60 mg by mouth daily.         . clonazePAM (KLONOPIN) 0.5 MG tablet   Oral   Take 0.5 mg by mouth 2 (two) times daily as needed for anxiety.         . meloxicam (MOBIC) 7.5 MG tablet  Oral   Take 7.5 mg by mouth daily.         . Oxcarbazepine (TRILEPTAL) 300 MG tablet   Oral   Take 300 mg by mouth 2 (two) times daily.         Marland Kitchen doxycycline (VIBRAMYCIN) 100 MG capsule   Oral   Take 1 capsule (100 mg total) by mouth 2 (two) times daily. One po bid x 7 days   14 capsule   0   . fluticasone (VERAMYST) 27.5 MCG/SPRAY nasal spray   Nasal   Place 2 sprays into the nose daily. AM         . HYDROcodone-acetaminophen (NORCO/VICODIN) 5-325 MG per tablet   Oral   Take 2 tablets by mouth every 4 (four) hours as needed.   15 tablet   0    BP 141/101  Pulse 105  Temp(Src) 98.2 F (36.8 C) (Oral)  Resp 16  Ht 5\' 2"  (1.575 m)  Wt 214 lb (97.07 kg)  BMI 39.13 kg/m2  SpO2 97% Physical Exam   Constitutional: She appears well-developed and well-nourished.  Morbidly obese  HENT:  Mouth/Throat: Oropharynx is clear and moist.  Musculoskeletal: Normal range of motion. She exhibits no edema and no tenderness.  Patient has no swelling of the arm as compared to the right arm. She has no pain on range of motion of the shoulder joint. She does have pain with specific point tenderness to the left axilla. She feels like there's a small abscess developing in this area. I feel very small amount swelling there is no redness or erythema in the area. No induration or fluctuance. There is also pain along the inner aspect of the left arm down to the thumb. She has some subjective numbness to the first 3 fingers of the left hand which she states is chronic due to her carpal tunnel but is worse today. She has good motor strength in the hand and arm.  Skin: Skin is warm and dry.    ED Course  Procedures (including critical care time) Labs Review Labs Reviewed - No data to display Imaging Review No results found.  EKG Interpretation   None       MDM   1. Neuralgia    Jamesetta So his neuropathic pain. It could be related to a small abscesses developing in the axilla. It seems to be where the pain is originating from. She has some scar tissue in her left wrist from prior surgery and I did not palpate a radial pulse but she has a strong radial pulse by Doppler. She has normal color in no signs of vascular compromise in the hand. She has no swelling or suggestion of DVT in the arm. I will go and start her on doxycycline and and Vicodin for pain. She states she's taken Vicodin in the past without problems. I encouraged her followup with her primary care physician within the next few days for recheck or return here she has any worsening pain or swelling in the arm.    Rolan Bucco, MD 01/26/13 [id number]

## 2013-01-26 NOTE — ED Notes (Signed)
Pt reports onset of left arm pain that started yesterday, associated with left hand tingling.  Pt denies any injury.

## 2013-03-20 ENCOUNTER — Emergency Department (HOSPITAL_BASED_OUTPATIENT_CLINIC_OR_DEPARTMENT_OTHER): Payer: Medicaid Other

## 2013-03-20 ENCOUNTER — Encounter (HOSPITAL_BASED_OUTPATIENT_CLINIC_OR_DEPARTMENT_OTHER): Payer: Self-pay | Admitting: Emergency Medicine

## 2013-03-20 ENCOUNTER — Emergency Department (HOSPITAL_BASED_OUTPATIENT_CLINIC_OR_DEPARTMENT_OTHER)
Admission: EM | Admit: 2013-03-20 | Discharge: 2013-03-20 | Disposition: A | Discharge status: Home / Self Care | Payer: Medicaid Other | Attending: Emergency Medicine | Admitting: Emergency Medicine

## 2013-03-20 DIAGNOSIS — F3289 Other specified depressive episodes: Secondary | ICD-10-CM | POA: Insufficient documentation

## 2013-03-20 DIAGNOSIS — Z79899 Other long term (current) drug therapy: Secondary | ICD-10-CM | POA: Insufficient documentation

## 2013-03-20 DIAGNOSIS — Z8614 Personal history of Methicillin resistant Staphylococcus aureus infection: Secondary | ICD-10-CM | POA: Insufficient documentation

## 2013-03-20 DIAGNOSIS — Z791 Long term (current) use of non-steroidal anti-inflammatories (NSAID): Secondary | ICD-10-CM | POA: Insufficient documentation

## 2013-03-20 DIAGNOSIS — S93409A Sprain of unspecified ligament of unspecified ankle, initial encounter: Secondary | ICD-10-CM | POA: Insufficient documentation

## 2013-03-20 DIAGNOSIS — Z8739 Personal history of other diseases of the musculoskeletal system and connective tissue: Secondary | ICD-10-CM | POA: Insufficient documentation

## 2013-03-20 DIAGNOSIS — Y9289 Other specified places as the place of occurrence of the external cause: Secondary | ICD-10-CM | POA: Insufficient documentation

## 2013-03-20 DIAGNOSIS — X500XXA Overexertion from strenuous movement or load, initial encounter: Secondary | ICD-10-CM | POA: Insufficient documentation

## 2013-03-20 DIAGNOSIS — F909 Attention-deficit hyperactivity disorder, unspecified type: Secondary | ICD-10-CM | POA: Insufficient documentation

## 2013-03-20 DIAGNOSIS — F329 Major depressive disorder, single episode, unspecified: Secondary | ICD-10-CM | POA: Insufficient documentation

## 2013-03-20 DIAGNOSIS — Y9389 Activity, other specified: Secondary | ICD-10-CM | POA: Insufficient documentation

## 2013-03-20 DIAGNOSIS — IMO0002 Reserved for concepts with insufficient information to code with codable children: Secondary | ICD-10-CM | POA: Insufficient documentation

## 2013-03-20 DIAGNOSIS — F17200 Nicotine dependence, unspecified, uncomplicated: Secondary | ICD-10-CM | POA: Insufficient documentation

## 2013-03-20 DIAGNOSIS — Z8679 Personal history of other diseases of the circulatory system: Secondary | ICD-10-CM | POA: Insufficient documentation

## 2013-03-20 DIAGNOSIS — G8929 Other chronic pain: Secondary | ICD-10-CM | POA: Insufficient documentation

## 2013-03-20 NOTE — ED Provider Notes (Signed)
CSN: [id number]     Arrival date & time 03/20/13  1827 History  This chart was scribed for Charles B. Karle Starch, MD by Rolanda Lundborg, ED Scribe. This patient was seen in room MHH2/MHH2 and the patient's care was started at 8:39 PM.    Chief Complaint  Patient presents with  . Ankle Injury   The history is provided by the patient. No language interpreter was used.   HPI Comments: [name] is a 38 y.o. female who presents to the Emergency Department complaining of constant, aching bilateral ankle pain, worse on the left, since hitting the landing at full speed while zip lining today. Pt states she landed feet first. She is able to bear weight on the right foot but not the left.    Past Medical History  Diagnosis Date  . Chronic pain     shoulders, arms, hands  . Irregular heartbeat 03/2011    determined to be palpitations, per Dr Irven Shelling office note  . Environmental allergies   . De Quervain's tenosynovitis, left 04/2011  . Carpal tunnel syndrome on both sides 04/2011  . Hx MRSA infection 2009  . Sleep apnea     denies sx., but is to have sleep study 05/2011  . Depression   . Depression   . ADHD (attention deficit hyperactivity disorder)    Past Surgical History  Procedure Laterality Date  . Cesarean section      x 3  . Abdominal hysterectomy      partial  . Carpal tunnel release  04/30/2011    Procedure: CARPAL TUNNEL RELEASE;  Surgeon: Cammie Sickle., MD;  Location: [place];  Service: Orthopedics;  Laterality: Left;  . Dorsal compartment release  04/30/2011    Procedure: RELEASE DORSAL COMPARTMENT (DEQUERVAIN);  Surgeon: Cammie Sickle., MD;  Location: [facility];  Service: Orthopedics;  Laterality: Left;   No family history on file. History  Substance Use Topics  . Smoking status: Current Every Day Smoker -- 10 years    Types: Cigarettes  . Smokeless tobacco: Never Used     Comment: 6-7 cig./day  . Alcohol Use: No   OB History    Grav Para Term Preterm Abortions TAB SAB Ect Mult Living                 Review of Systems A complete 10 system review of systems was obtained and all systems are negative except as noted in the HPI and PMH.     Allergies  Adhesive; Darvocet; Percocet; and Tramadol  Home Medications   Current Outpatient Rx  Name  Route  Sig  Dispense  Refill  . atomoxetine (STRATTERA) 60 MG capsule   Oral   Take 60 mg by mouth daily.         . clonazePAM (KLONOPIN) 0.5 MG tablet   Oral   Take 0.5 mg by mouth 2 (two) times daily as needed for anxiety.         Marland Kitchen doxycycline (VIBRAMYCIN) 100 MG capsule   Oral   Take 1 capsule (100 mg total) by mouth 2 (two) times daily. One po bid x 7 days   14 capsule   0   . fluticasone (VERAMYST) 27.5 MCG/SPRAY nasal spray   Nasal   Place 2 sprays into the nose daily. AM         . HYDROcodone-acetaminophen (NORCO/VICODIN) 5-325 MG per tablet   Oral   Take 2 tablets by mouth every  4 (four) hours as needed.   15 tablet   0   . meloxicam (MOBIC) 7.5 MG tablet   Oral   Take 7.5 mg by mouth daily.         . Oxcarbazepine (TRILEPTAL) 300 MG tablet   Oral   Take 300 mg by mouth 2 (two) times daily.          BP 137/87  Pulse 108  Temp(Src) 98.5 F (36.9 C)  Resp 18  Ht 5\' 2"  (1.575 m)  Wt 214 lb (97.07 kg)  BMI 39.13 kg/m2  SpO2 100% Physical Exam  Nursing note and vitals reviewed. Constitutional: She is oriented to person, place, and time. She appears well-developed and well-nourished.  HENT:  Head: Normocephalic and atraumatic.  Eyes: EOM are normal. Pupils are equal, round, and reactive to light.  Neck: Normal range of motion. Neck supple.  Cardiovascular: Normal rate, normal heart sounds and intact distal pulses.   Pulmonary/Chest: Effort normal and breath sounds normal.  Abdominal: Bowel sounds are normal. She exhibits no distension. There is no tenderness.  Musculoskeletal: Normal range of motion. She exhibits no edema and  no tenderness.  Soft tissue tenderness to bilateral ankles. No bony tenderness.  Neurological: She is alert and oriented to person, place, and time. She has normal strength. No cranial nerve deficit or sensory deficit.  Skin: Skin is warm and dry. No rash noted.  Psychiatric: She has a normal mood and affect.    ED Course  Procedures (including critical care time) Medications - No data to display  DIAGNOSTIC STUDIES: Oxygen Saturation is 100% on RA, normal by my interpretation.    COORDINATION OF CARE: 8:45 PM- Discussed treatment plan with pt. Pt agrees to plan.    Labs Review Labs Reviewed - No data to display Imaging Review Dg Ankle Complete Left  03/20/2013   CLINICAL DATA:  Zip line injury, now with ankle pain  EXAM: LEFT ANKLE COMPLETE - 3+ VIEW  COMPARISON:  None.  FINDINGS: No fracture or dislocation. Joint spaces are preserved. The ankle mortise is preserved. No ankle joint effusion. Minimal enthesopathic change of the Achilles tendon insertion site. Regional soft tissues are normal. No radiopaque foreign body.  IMPRESSION: No acute findings.   Electronically Signed   By: Sandi Mariscal M.D.   On: 03/20/2013 19:43   Dg Ankle Complete Right  03/20/2013   CLINICAL DATA:  Zip line injury, now with ankle pain  EXAM: RIGHT ANKLE - COMPLETE 3+ VIEW  COMPARISON:  None.  FINDINGS: No fracture or dislocation. Joint spaces are preserved. Ankle mortise is preserved. No ankle joint effusion. Regional soft tissues appear normal. No radiopaque foreign body.  IMPRESSION: No acute findings.   Electronically Signed   By: Sandi Mariscal M.D.   On: 03/20/2013 19:44    EKG Interpretation   None       MDM   Final diagnoses:  Ankle sprain    Xrays done in triage neg. Pt reports pain on right has completely resolved. Still some on the left. ASO, crutches, RICE.   I personally performed the services described in this documentation, which was scribed in my presence. The recorded information has  been reviewed and is accurate.      Charles B. Karle Starch, MD 03/20/13 2053

## 2013-03-20 NOTE — ED Notes (Signed)
Zip lining today- hit the pads at one of the landings at full speed, feet first.  C/o left ankle pain.

## 2013-03-20 NOTE — ED Notes (Signed)
Pt in xray for left ankle film- reported to xray tech she hit both ankles while ziplining and unable to stand on right ankle- Right ankle film added per protocol

## 2013-03-20 NOTE — Discharge Instructions (Signed)

## 2013-11-07 ENCOUNTER — Emergency Department (HOSPITAL_BASED_OUTPATIENT_CLINIC_OR_DEPARTMENT_OTHER)
Admission: EM | Admit: 2013-11-07 | Discharge: 2013-11-07 | Disposition: A | Discharge status: Home / Self Care | Payer: Medicaid Other | Attending: Emergency Medicine | Admitting: Emergency Medicine

## 2013-11-07 ENCOUNTER — Encounter (HOSPITAL_BASED_OUTPATIENT_CLINIC_OR_DEPARTMENT_OTHER): Payer: Self-pay | Admitting: Emergency Medicine

## 2013-11-07 DIAGNOSIS — S60561A Insect bite (nonvenomous) of right hand, initial encounter: Secondary | ICD-10-CM | POA: Diagnosis present

## 2013-11-07 DIAGNOSIS — Z8669 Personal history of other diseases of the nervous system and sense organs: Secondary | ICD-10-CM | POA: Diagnosis not present

## 2013-11-07 DIAGNOSIS — Z8614 Personal history of Methicillin resistant Staphylococcus aureus infection: Secondary | ICD-10-CM | POA: Insufficient documentation

## 2013-11-07 DIAGNOSIS — F909 Attention-deficit hyperactivity disorder, unspecified type: Secondary | ICD-10-CM | POA: Insufficient documentation

## 2013-11-07 DIAGNOSIS — Y9389 Activity, other specified: Secondary | ICD-10-CM | POA: Insufficient documentation

## 2013-11-07 DIAGNOSIS — W57XXXA Bitten or stung by nonvenomous insect and other nonvenomous arthropods, initial encounter: Secondary | ICD-10-CM | POA: Insufficient documentation

## 2013-11-07 DIAGNOSIS — Y9289 Other specified places as the place of occurrence of the external cause: Secondary | ICD-10-CM | POA: Insufficient documentation

## 2013-11-07 DIAGNOSIS — G4730 Sleep apnea, unspecified: Secondary | ICD-10-CM | POA: Insufficient documentation

## 2013-11-07 DIAGNOSIS — Z791 Long term (current) use of non-steroidal anti-inflammatories (NSAID): Secondary | ICD-10-CM | POA: Diagnosis not present

## 2013-11-07 DIAGNOSIS — F329 Major depressive disorder, single episode, unspecified: Secondary | ICD-10-CM | POA: Diagnosis not present

## 2013-11-07 DIAGNOSIS — G8929 Other chronic pain: Secondary | ICD-10-CM | POA: Diagnosis not present

## 2013-11-07 DIAGNOSIS — Z7951 Long term (current) use of inhaled steroids: Secondary | ICD-10-CM | POA: Insufficient documentation

## 2013-11-07 DIAGNOSIS — Z8739 Personal history of other diseases of the musculoskeletal system and connective tissue: Secondary | ICD-10-CM | POA: Insufficient documentation

## 2013-11-07 DIAGNOSIS — Z720 Tobacco use: Secondary | ICD-10-CM | POA: Diagnosis not present

## 2013-11-07 DIAGNOSIS — Z79899 Other long term (current) drug therapy: Secondary | ICD-10-CM | POA: Diagnosis not present

## 2013-11-07 NOTE — Discharge Instructions (Signed)

## 2013-11-07 NOTE — ED Notes (Signed)
Pt presents to ED with complaints of bug bites to both hands.

## 2013-11-07 NOTE — ED Provider Notes (Signed)
CSN: [id number]     Arrival date & time 11/07/13  1808 History   This chart was scribed for Evelina Bucy, MD by Randa Evens, ED Scribe. This patient was seen in room MH03/MH03 and the patient's care was started at 6:16 PM.    Chief Complaint  Patient presents with  . Insect Bite    Patient is a 38 y.o. female presenting with animal bite. The history is provided by the patient. No language interpreter was used.  Animal Bite Contact animal:  Insect Location:  Hand Hand injury location:  R hand Time since incident:  1 day Pain details:    Quality:  Itching   Severity:  No pain   Progression:  Improving Associated symptoms: no fever    HPI Comments: [name] is a 38 y.o. female who presents to the Emergency Department complaining of bug bite to her right hand onset 1 day prior. She states she had some associated redness and itching Denies fever, nausea or vomiting.  Past Medical History  Diagnosis Date  . Chronic pain     shoulders, arms, hands  . Irregular heartbeat 03/2011    determined to be palpitations, per Dr Irven Shelling office note  . Environmental allergies   . De Quervain's tenosynovitis, left 04/2011  . Carpal tunnel syndrome on both sides 04/2011  . Hx MRSA infection 2009  . Sleep apnea     denies sx., but is to have sleep study 05/2011  . Depression   . Depression   . ADHD (attention deficit hyperactivity disorder)    Past Surgical History  Procedure Laterality Date  . Cesarean section      x 3  . Abdominal hysterectomy      partial  . Carpal tunnel release  04/30/2011    Procedure: CARPAL TUNNEL RELEASE;  Surgeon: Cammie Sickle., MD;  Location: [place];  Service: Orthopedics;  Laterality: Left;  . Dorsal compartment release  04/30/2011    Procedure: RELEASE DORSAL COMPARTMENT (DEQUERVAIN);  Surgeon: Cammie Sickle., MD;  Location: [facility];  Service: Orthopedics;  Laterality: Left;   No family history on  file. History  Substance Use Topics  . Smoking status: Current Every Day Smoker -- 10 years    Types: Cigarettes  . Smokeless tobacco: Never Used     Comment: 6-7 cig./day  . Alcohol Use: No   OB History   Grav Para Term Preterm Abortions TAB SAB Ect Mult Living                 Review of Systems  Constitutional: Negative for fever.  Gastrointestinal: Negative for nausea and vomiting.  Skin: Positive for wound.  All other systems reviewed and are negative.   Allergies  Adhesive; Darvocet; Percocet; and Tramadol  Home Medications   Prior to Admission medications   Medication Sig Start Date End Date Taking? Authorizing Provider  atomoxetine (STRATTERA) 60 MG capsule Take 60 mg by mouth daily.    Historical Provider, MD  clonazePAM (KLONOPIN) 0.5 MG tablet Take 0.5 mg by mouth 2 (two) times daily as needed for anxiety.    Historical Provider, MD  doxycycline (VIBRAMYCIN) 100 MG capsule Take 1 capsule (100 mg total) by mouth 2 (two) times daily. One po bid x 7 days 01/26/13   Malvin Johns, MD  fluticasone (VERAMYST) 27.5 MCG/SPRAY nasal spray Place 2 sprays into the nose daily. AM    Historical Provider, MD  HYDROcodone-acetaminophen (NORCO/VICODIN) 5-325 MG per  tablet Take 2 tablets by mouth every 4 (four) hours as needed. 01/26/13   Malvin Johns, MD  meloxicam (MOBIC) 7.5 MG tablet Take 7.5 mg by mouth daily.    Historical Provider, MD  Oxcarbazepine (TRILEPTAL) 300 MG tablet Take 300 mg by mouth 2 (two) times daily.    Historical Provider, MD   Triage Vitals: BP 135/102  Pulse 97  Temp(Src) 98.1 F (36.7 C) (Oral)  Resp 18  Ht 5\' 1"  (1.549 m)  Wt 196 lb (88.905 kg)  BMI 37.05 kg/m2  SpO2 100%  Physical Exam  Nursing note and vitals reviewed. Constitutional: She is oriented to person, place, and time. She appears well-developed and well-nourished.  HENT:  Head: Normocephalic and atraumatic.  Right Ear: External ear normal.  Left Ear: External ear normal.  Nose:  Nose normal.  Mouth/Throat: Oropharynx is clear and moist.  Eyes: Conjunctivae and EOM are normal. Pupils are equal, round, and reactive to light.  Neck: Normal range of motion. Neck supple.  Cardiovascular: Normal rate, regular rhythm, normal heart sounds and intact distal pulses.   Pulmonary/Chest: Effort normal and breath sounds normal.  Abdominal: Soft. Bowel sounds are normal.  Musculoskeletal: Normal range of motion.  Neurological: She is alert and oriented to person, place, and time. She has normal reflexes.  Skin: Skin is warm and dry.  Small 0.5 cm lesion on L hand. No surrounding erythema, cellulitis, induration, fluctuance.   Psychiatric: She has a normal mood and affect. Her behavior is normal. Judgment and thought content normal.    ED Course  Procedures (including critical care time) DIAGNOSTIC STUDIES: Oxygen Saturation is 100% on RA, normal by my interpretation.    COORDINATION OF CARE: 6:23 PM-Discussed treatment plan which includes benadryl with pt at bedside and pt agreed to plan.     Labs Review Labs Reviewed - No data to display  Imaging Review No results found.   EKG Interpretation None      MDM   Final diagnoses:  Bug bite    Bug bite on hand since yesterday. +pruritis. No signs of infection. Stable for discharge.   I personally performed the services described in this documentation, which was scribed in my presence. The recorded information has been reviewed and is accurate.       Evelina Bucy, MD 11/07/13 [phone]

## 2013-11-07 NOTE — ED Notes (Signed)
Antibiotic and band aid applied to thumb, site wnl

## 2014-04-07 ENCOUNTER — Emergency Department (HOSPITAL_BASED_OUTPATIENT_CLINIC_OR_DEPARTMENT_OTHER)
Admission: EM | Admit: 2014-04-07 | Discharge: 2014-04-08 | Discharge status: Against Medical Advice | Payer: Medicaid Other | Attending: Emergency Medicine | Admitting: Emergency Medicine

## 2014-04-07 ENCOUNTER — Emergency Department (HOSPITAL_BASED_OUTPATIENT_CLINIC_OR_DEPARTMENT_OTHER): Payer: Medicaid Other

## 2014-04-07 ENCOUNTER — Encounter (HOSPITAL_BASED_OUTPATIENT_CLINIC_OR_DEPARTMENT_OTHER): Payer: Self-pay | Admitting: *Deleted

## 2014-04-07 DIAGNOSIS — Z720 Tobacco use: Secondary | ICD-10-CM | POA: Insufficient documentation

## 2014-04-07 DIAGNOSIS — M79602 Pain in left arm: Secondary | ICD-10-CM | POA: Insufficient documentation

## 2014-04-07 DIAGNOSIS — M542 Cervicalgia: Secondary | ICD-10-CM | POA: Insufficient documentation

## 2014-04-07 DIAGNOSIS — R079 Chest pain, unspecified: Secondary | ICD-10-CM | POA: Diagnosis not present

## 2014-04-07 DIAGNOSIS — G8929 Other chronic pain: Secondary | ICD-10-CM | POA: Diagnosis not present

## 2014-04-07 LAB — BASIC METABOLIC PANEL
ANION GAP: 1 — AB (ref 5–15)
BUN: 6 mg/dL (ref 6–23)
CHLORIDE: 110 mmol/L (ref 96–112)
CO2: 22 mmol/L (ref 19–32)
CREATININE: 0.68 mg/dL (ref 0.50–1.10)
Calcium: 8.7 mg/dL (ref 8.4–10.5)
GLUCOSE: 93 mg/dL (ref 70–99)
POTASSIUM: 3.2 mmol/L — AB (ref 3.5–5.1)
Sodium: 133 mmol/L — ABNORMAL LOW (ref 135–145)

## 2014-04-07 LAB — CBC
HEMATOCRIT: 40.6 % (ref 36.0–46.0)
Hemoglobin: 13.4 g/dL (ref 12.0–15.0)
MCH: 28.6 pg (ref 26.0–34.0)
MCHC: 33 g/dL (ref 30.0–36.0)
MCV: 86.6 fL (ref 78.0–100.0)
Platelets: 204 10*3/uL (ref 150–400)
RBC: 4.69 MIL/uL (ref 3.87–5.11)
RDW: 15 % (ref 11.5–15.5)
WBC: 4.4 10*3/uL (ref 4.0–10.5)

## 2014-04-07 NOTE — ED Notes (Signed)
Pt reports cp for over a week with pain in her left arm and left neck.

## 2014-04-08 LAB — TROPONIN I: Troponin I: <0.03 ng/mL (ref <0.031)

## 2014-04-08 NOTE — ED Notes (Signed)
Pt.  Left AMA due to not pleased with her interaction with EDP.  At time of EDP entering Pt. Room the Pt. seemed upset and unhappy.  EDP was professional and spoke to Pt. In short professional manner in such Pt. Felt was not very nice.  Pt. Was in no distress and had no shortness of breath noted.   RN assessment was Unremarkable with no s/s  Of distress  Pt. Was alert and oriented with clear speech.   Pt. Told EDP that he could leave her room and not return because she was unhappy with the way he asked her his questions.  RN called for Charge Nurse to come and speak with Pt. About situation due to Pt. Seemed upset and the situation was already at a peak.  Before the Pt. Left the facility she broke down about her grandsons death last 07/30/2022 and her grandfathers death last year.  Pt.seemed to be Carrying a lot of grief.

## 2014-04-08 NOTE — ED Notes (Signed)
Pt. Just returned from Radiology 

## 2014-12-15 ENCOUNTER — Encounter (HOSPITAL_BASED_OUTPATIENT_CLINIC_OR_DEPARTMENT_OTHER): Payer: Self-pay

## 2014-12-15 ENCOUNTER — Emergency Department (HOSPITAL_BASED_OUTPATIENT_CLINIC_OR_DEPARTMENT_OTHER)
Admission: EM | Admit: 2014-12-15 | Discharge: 2014-12-15 | Disposition: A | Discharge status: Home / Self Care | Payer: BLUE CROSS/BLUE SHIELD | Attending: Emergency Medicine | Admitting: Emergency Medicine

## 2014-12-15 DIAGNOSIS — Z79899 Other long term (current) drug therapy: Secondary | ICD-10-CM | POA: Diagnosis not present

## 2014-12-15 DIAGNOSIS — Z720 Tobacco use: Secondary | ICD-10-CM | POA: Diagnosis not present

## 2014-12-15 DIAGNOSIS — Z7951 Long term (current) use of inhaled steroids: Secondary | ICD-10-CM | POA: Insufficient documentation

## 2014-12-15 DIAGNOSIS — Z8614 Personal history of Methicillin resistant Staphylococcus aureus infection: Secondary | ICD-10-CM | POA: Diagnosis not present

## 2014-12-15 DIAGNOSIS — Z8739 Personal history of other diseases of the musculoskeletal system and connective tissue: Secondary | ICD-10-CM | POA: Insufficient documentation

## 2014-12-15 DIAGNOSIS — F329 Major depressive disorder, single episode, unspecified: Secondary | ICD-10-CM | POA: Diagnosis not present

## 2014-12-15 DIAGNOSIS — J029 Acute pharyngitis, unspecified: Secondary | ICD-10-CM | POA: Diagnosis not present

## 2014-12-15 DIAGNOSIS — F909 Attention-deficit hyperactivity disorder, unspecified type: Secondary | ICD-10-CM | POA: Insufficient documentation

## 2014-12-15 DIAGNOSIS — M791 Myalgia: Secondary | ICD-10-CM | POA: Diagnosis not present

## 2014-12-15 DIAGNOSIS — Z8669 Personal history of other diseases of the nervous system and sense organs: Secondary | ICD-10-CM | POA: Insufficient documentation

## 2014-12-15 DIAGNOSIS — G8929 Other chronic pain: Secondary | ICD-10-CM | POA: Diagnosis not present

## 2014-12-15 DIAGNOSIS — Z8679 Personal history of other diseases of the circulatory system: Secondary | ICD-10-CM | POA: Diagnosis not present

## 2014-12-15 LAB — RAPID STREP SCREEN (MED CTR MEBANE ONLY): STREPTOCOCCUS, GROUP A SCREEN (DIRECT): NEGATIVE

## 2014-12-15 MED ORDER — PENICILLIN G BENZATHINE 1200000 UNIT/2ML IM SUSP
1.2000 10*6.[IU] | Freq: Once | INTRAMUSCULAR | Status: AC
  Administered 2014-12-15: 1.2 10*6.[IU] via INTRAMUSCULAR
  Filled 2014-12-15: qty 2

## 2014-12-15 NOTE — ED Provider Notes (Signed)
CSN: [id number]     Arrival date & time 12/15/14  1436 History   First MD Initiated Contact with Patient 12/15/14 1507     Chief Complaint  Patient presents with  . Sore Throat     (Consider location/radiation/quality/duration/timing/severity/associated sxs/prior Treatment) HPI Comments: Pt states that she gets strep a couple of times a year and this is similar  Patient is a 39 y.o. female presenting with pharyngitis. No language interpreter was used.  Sore Throat This is a new problem. The current episode started in the past 7 days. The problem occurs constantly. The problem has been unchanged. Associated symptoms include a fever and myalgias. The symptoms are aggravated by swallowing. She has tried acetaminophen and NSAIDs for the symptoms.    Past Medical History  Diagnosis Date  . Chronic pain     shoulders, arms, hands  . Irregular heartbeat 03/2011    determined to be palpitations, per Dr Irven Shelling office note  . Environmental allergies   . De Quervain's tenosynovitis, left 04/2011  . Carpal tunnel syndrome on both sides 04/2011  . Hx MRSA infection 2009  . Sleep apnea     denies sx., but is to have sleep study 05/2011  . Depression   . Depression   . ADHD (attention deficit hyperactivity disorder)    Past Surgical History  Procedure Laterality Date  . Cesarean section      x 3  . Abdominal hysterectomy      partial  . Carpal tunnel release  04/30/2011    Procedure: CARPAL TUNNEL RELEASE;  Surgeon: Cammie Sickle., MD;  Location: [place];  Service: Orthopedics;  Laterality: Left;  . Dorsal compartment release  04/30/2011    Procedure: RELEASE DORSAL COMPARTMENT (DEQUERVAIN);  Surgeon: Cammie Sickle., MD;  Location: [facility];  Service: Orthopedics;  Laterality: Left;   No family history on file. Social History  Substance Use Topics  . Smoking status: Current Every Day Smoker -- 10 years    Types: Cigarettes  . Smokeless  tobacco: Never Used     Comment: 6-7 cig./day  . Alcohol Use: No   OB History    No data available     Review of Systems  Constitutional: Positive for fever.  Musculoskeletal: Positive for myalgias.  All other systems reviewed and are negative.     Allergies  Adhesive; Darvocet; Percocet; and Tramadol  Home Medications   Prior to Admission medications   Medication Sig Start Date End Date Taking? Authorizing Provider  fluticasone (VERAMYST) 27.5 MCG/SPRAY nasal spray Place 2 sprays into the nose daily. AM   Yes Historical Provider, MD  lisdexamfetamine (VYVANSE) 40 MG capsule Take 40 mg by mouth every morning.   Yes Historical Provider, MD  atomoxetine (STRATTERA) 60 MG capsule Take 60 mg by mouth daily.    Historical Provider, MD  clonazePAM (KLONOPIN) 0.5 MG tablet Take 0.5 mg by mouth 2 (two) times daily as needed for anxiety.    Historical Provider, MD  doxycycline (VIBRAMYCIN) 100 MG capsule Take 1 capsule (100 mg total) by mouth 2 (two) times daily. One po bid x 7 days 01/26/13   Malvin Johns, MD  HYDROcodone-acetaminophen (NORCO/VICODIN) 5-325 MG per tablet Take 2 tablets by mouth every 4 (four) hours as needed. 01/26/13   Malvin Johns, MD  meloxicam (MOBIC) 7.5 MG tablet Take 7.5 mg by mouth daily.    Historical Provider, MD  Oxcarbazepine (TRILEPTAL) 300 MG tablet Take 300 mg by  mouth 2 (two) times daily.    Historical Provider, MD   BP 132/88 mmHg  Pulse 91  Temp(Src) 98.5 F (36.9 C) (Oral)  Resp 20  Ht 5' 1.5" (1.562 m)  Wt 198 lb (89.812 kg)  BMI 36.81 kg/m2  SpO2 99% Physical Exam  Constitutional: She is oriented to person, place, and time. She appears well-developed.  HENT:  Right Ear: External ear normal.  Left Ear: External ear normal.  Mouth/Throat: Oropharyngeal exudate, posterior oropharyngeal edema and posterior oropharyngeal erythema present.  Eyes: Conjunctivae and EOM are normal. Pupils are equal, round, and reactive to light.  Cardiovascular:  Normal rate and regular rhythm.   Pulmonary/Chest: Effort normal and breath sounds normal.  Musculoskeletal: Normal range of motion.  Neurological: She is alert and oriented to person, place, and time.  Nursing note and vitals reviewed.   ED Course  Procedures (including critical care time) Labs Review Labs Reviewed  RAPID STREP SCREEN (NOT AT [facility])  CULTURE, GROUP A STREP    Imaging Review No results found. I have personally reviewed and evaluated these images and lab results as part of my medical decision-making.   EKG Interpretation None      MDM   Final diagnoses:  Pharyngitis    Strep negative although treated based on exam. No sign of pta. Discussed return precautions. Given bicillin here    Glendell Docker, NP 12/15/14 1542  Davonna Belling, MD 12/15/14 2252

## 2014-12-15 NOTE — Discharge Instructions (Signed)

## 2014-12-15 NOTE — ED Notes (Signed)
np at bedside

## 2014-12-15 NOTE — ED Notes (Signed)
Reports sore throat that started Tuesday and she had fever and chills that day.  Reports has not had fever since but still having chills.

## 2014-12-17 LAB — CULTURE, GROUP A STREP

## 2015-03-19 ENCOUNTER — Encounter (HOSPITAL_BASED_OUTPATIENT_CLINIC_OR_DEPARTMENT_OTHER): Payer: Self-pay | Admitting: Emergency Medicine

## 2015-03-19 ENCOUNTER — Emergency Department (HOSPITAL_BASED_OUTPATIENT_CLINIC_OR_DEPARTMENT_OTHER)
Admission: EM | Admit: 2015-03-19 | Discharge: 2015-03-19 | Disposition: A | Discharge status: Home / Self Care | Payer: Managed Care, Other (non HMO) | Attending: Emergency Medicine | Admitting: Emergency Medicine

## 2015-03-19 DIAGNOSIS — G8929 Other chronic pain: Secondary | ICD-10-CM | POA: Insufficient documentation

## 2015-03-19 DIAGNOSIS — Z8679 Personal history of other diseases of the circulatory system: Secondary | ICD-10-CM | POA: Insufficient documentation

## 2015-03-19 DIAGNOSIS — Z8614 Personal history of Methicillin resistant Staphylococcus aureus infection: Secondary | ICD-10-CM | POA: Insufficient documentation

## 2015-03-19 DIAGNOSIS — Z7951 Long term (current) use of inhaled steroids: Secondary | ICD-10-CM | POA: Diagnosis not present

## 2015-03-19 DIAGNOSIS — Y9389 Activity, other specified: Secondary | ICD-10-CM | POA: Insufficient documentation

## 2015-03-19 DIAGNOSIS — Z8739 Personal history of other diseases of the musculoskeletal system and connective tissue: Secondary | ICD-10-CM | POA: Diagnosis not present

## 2015-03-19 DIAGNOSIS — Z791 Long term (current) use of non-steroidal anti-inflammatories (NSAID): Secondary | ICD-10-CM | POA: Diagnosis not present

## 2015-03-19 DIAGNOSIS — S3992XA Unspecified injury of lower back, initial encounter: Secondary | ICD-10-CM | POA: Insufficient documentation

## 2015-03-19 DIAGNOSIS — M545 Low back pain: Secondary | ICD-10-CM

## 2015-03-19 DIAGNOSIS — Z79899 Other long term (current) drug therapy: Secondary | ICD-10-CM | POA: Insufficient documentation

## 2015-03-19 DIAGNOSIS — F17210 Nicotine dependence, cigarettes, uncomplicated: Secondary | ICD-10-CM | POA: Diagnosis not present

## 2015-03-19 DIAGNOSIS — F909 Attention-deficit hyperactivity disorder, unspecified type: Secondary | ICD-10-CM | POA: Diagnosis not present

## 2015-03-19 DIAGNOSIS — Y998 Other external cause status: Secondary | ICD-10-CM | POA: Diagnosis not present

## 2015-03-19 DIAGNOSIS — F329 Major depressive disorder, single episode, unspecified: Secondary | ICD-10-CM | POA: Insufficient documentation

## 2015-03-19 DIAGNOSIS — Y92410 Unspecified street and highway as the place of occurrence of the external cause: Secondary | ICD-10-CM | POA: Insufficient documentation

## 2015-03-19 MED ORDER — ACETAMINOPHEN 325 MG PO TABS
650.0000 mg | ORAL_TABLET | Freq: Once | ORAL | Status: AC
  Administered 2015-03-19: 650 mg via ORAL
  Filled 2015-03-19: qty 2

## 2015-03-19 MED ORDER — METHOCARBAMOL 500 MG PO TABS: 500.0000 mg | ORAL_TABLET | Freq: Two times a day (BID) | ORAL | Status: DC | PRN

## 2015-03-19 MED ORDER — NAPROXEN 250 MG PO TABS: 250.0000 mg | ORAL_TABLET | Freq: Two times a day (BID) | ORAL | Status: DC
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
## 2016-03-15 ENCOUNTER — Encounter (HOSPITAL_COMMUNITY): Payer: Self-pay

## 2016-03-15 NOTE — Progress Notes (Signed)
Received [place] paperwork on behalf of patient, however, patient has never been seen in CHF clinic. Return fax sent stating this.  Renee Pain, RN

## 2017-02-04 HISTORY — PX: BREAST BIOPSY: SHX20

## 2017-05-29 ENCOUNTER — Other Ambulatory Visit: Payer: Self-pay | Admitting: Physician Assistant

## 2017-05-29 DIAGNOSIS — Z1231 Encounter for screening mammogram for malignant neoplasm of breast: Secondary | ICD-10-CM

## 2017-06-18 ENCOUNTER — Ambulatory Visit: Payer: BLUE CROSS/BLUE SHIELD

## 2017-07-09 ENCOUNTER — Ambulatory Visit
Admission: RE | Admit: 2017-07-09 | Discharge: 2017-07-09 | Disposition: A | Discharge status: Home / Self Care | Payer: Medicaid Other | Source: Ambulatory Visit | Attending: Physician Assistant | Admitting: Physician Assistant

## 2017-07-09 DIAGNOSIS — Z1231 Encounter for screening mammogram for malignant neoplasm of breast: Secondary | ICD-10-CM

## 2017-07-10 ENCOUNTER — Other Ambulatory Visit: Payer: Self-pay | Admitting: Physician Assistant

## 2017-07-10 DIAGNOSIS — R928 Other abnormal and inconclusive findings on diagnostic imaging of breast: Secondary | ICD-10-CM

## 2017-07-16 ENCOUNTER — Other Ambulatory Visit: Payer: Self-pay | Admitting: Physician Assistant

## 2017-07-16 ENCOUNTER — Ambulatory Visit
Admission: RE | Admit: 2017-07-16 | Discharge: 2017-07-16 | Disposition: A | Discharge status: Home / Self Care | Payer: Medicaid Other | Source: Ambulatory Visit | Attending: Physician Assistant | Admitting: Physician Assistant

## 2017-07-16 DIAGNOSIS — R928 Other abnormal and inconclusive findings on diagnostic imaging of breast: Secondary | ICD-10-CM

## 2017-07-16 DIAGNOSIS — N6310 Unspecified lump in the right breast, unspecified quadrant: Secondary | ICD-10-CM

## 2017-07-17 ENCOUNTER — Other Ambulatory Visit: Payer: Self-pay | Admitting: Physician Assistant

## 2017-07-17 ENCOUNTER — Ambulatory Visit
Admission: RE | Admit: 2017-07-17 | Discharge: 2017-07-17 | Disposition: A | Discharge status: Home / Self Care | Payer: Medicaid Other | Source: Ambulatory Visit | Attending: Physician Assistant | Admitting: Physician Assistant

## 2017-07-17 DIAGNOSIS — N6310 Unspecified lump in the right breast, unspecified quadrant: Secondary | ICD-10-CM

## 2017-07-31 ENCOUNTER — Ambulatory Visit: Payer: Self-pay | Admitting: General Surgery

## 2017-07-31 DIAGNOSIS — D219 Benign neoplasm of connective and other soft tissue, unspecified: Secondary | ICD-10-CM

## 2017-08-01 ENCOUNTER — Other Ambulatory Visit: Payer: Self-pay | Admitting: General Surgery

## 2017-08-01 DIAGNOSIS — D219 Benign neoplasm of connective and other soft tissue, unspecified: Secondary | ICD-10-CM

## 2017-09-01 ENCOUNTER — Other Ambulatory Visit: Payer: Self-pay

## 2017-09-01 ENCOUNTER — Encounter (HOSPITAL_BASED_OUTPATIENT_CLINIC_OR_DEPARTMENT_OTHER): Payer: Self-pay | Admitting: *Deleted

## 2017-09-04 ENCOUNTER — Ambulatory Visit
Admission: RE | Admit: 2017-09-04 | Discharge: 2017-09-04 | Disposition: A | Discharge status: Home / Self Care | Payer: Medicaid Other | Source: Ambulatory Visit | Attending: General Surgery | Admitting: General Surgery

## 2017-09-04 ENCOUNTER — Other Ambulatory Visit: Payer: Self-pay | Admitting: General Surgery

## 2017-09-04 DIAGNOSIS — D219 Benign neoplasm of connective and other soft tissue, unspecified: Secondary | ICD-10-CM

## 2017-09-04 NOTE — Progress Notes (Signed)
Pre-Surgery ensure and instructions given along with surgical soap and instructions.

## 2017-09-05 ENCOUNTER — Ambulatory Visit (HOSPITAL_BASED_OUTPATIENT_CLINIC_OR_DEPARTMENT_OTHER)
Admission: RE | Admit: 2017-09-05 | Discharge: 2017-09-05 | Disposition: A | Discharge status: Home / Self Care | Payer: Medicaid Other | Source: Ambulatory Visit | Attending: General Surgery | Admitting: General Surgery

## 2017-09-05 ENCOUNTER — Ambulatory Visit
Admission: RE | Admit: 2017-09-05 | Discharge: 2017-09-05 | Disposition: A | Discharge status: Home / Self Care | Payer: Medicaid Other | Source: Ambulatory Visit | Attending: General Surgery | Admitting: General Surgery

## 2017-09-05 ENCOUNTER — Ambulatory Visit (HOSPITAL_BASED_OUTPATIENT_CLINIC_OR_DEPARTMENT_OTHER): Payer: Medicaid Other | Admitting: Anesthesiology

## 2017-09-05 ENCOUNTER — Other Ambulatory Visit: Payer: Self-pay

## 2017-09-05 ENCOUNTER — Encounter (HOSPITAL_BASED_OUTPATIENT_CLINIC_OR_DEPARTMENT_OTHER)
Admission: RE | Disposition: A | Discharge status: Home / Self Care | Payer: Self-pay | Source: Ambulatory Visit | Attending: General Surgery

## 2017-09-05 ENCOUNTER — Encounter (HOSPITAL_BASED_OUTPATIENT_CLINIC_OR_DEPARTMENT_OTHER): Payer: Self-pay | Admitting: *Deleted

## 2017-09-05 DIAGNOSIS — F418 Other specified anxiety disorders: Secondary | ICD-10-CM | POA: Diagnosis not present

## 2017-09-05 DIAGNOSIS — Z885 Allergy status to narcotic agent status: Secondary | ICD-10-CM | POA: Diagnosis not present

## 2017-09-05 DIAGNOSIS — Z87891 Personal history of nicotine dependence: Secondary | ICD-10-CM | POA: Diagnosis not present

## 2017-09-05 DIAGNOSIS — Z79899 Other long term (current) drug therapy: Secondary | ICD-10-CM | POA: Insufficient documentation

## 2017-09-05 DIAGNOSIS — D241 Benign neoplasm of right breast: Secondary | ICD-10-CM | POA: Diagnosis present

## 2017-09-05 DIAGNOSIS — G4730 Sleep apnea, unspecified: Secondary | ICD-10-CM | POA: Diagnosis not present

## 2017-09-05 DIAGNOSIS — Z6829 Body mass index (BMI) 29.0-29.9, adult: Secondary | ICD-10-CM | POA: Diagnosis not present

## 2017-09-05 DIAGNOSIS — D219 Benign neoplasm of connective and other soft tissue, unspecified: Secondary | ICD-10-CM

## 2017-09-05 HISTORY — DX: Headache, unspecified: R51.9

## 2017-09-05 HISTORY — PX: BREAST LUMPECTOMY WITH RADIOACTIVE SEED LOCALIZATION: SHX6424

## 2017-09-05 HISTORY — DX: Anxiety disorder, unspecified: F41.9

## 2017-09-05 HISTORY — DX: Headache: R51

## 2017-09-05 SURGERY — BREAST LUMPECTOMY WITH RADIOACTIVE SEED LOCALIZATION
Anesthesia: General | Site: Breast | Laterality: Right

## 2017-09-05 MED ORDER — MIDAZOLAM HCL 2 MG/2ML IJ SOLN
INTRAMUSCULAR | Status: AC
  Filled 2017-09-05: qty 2

## 2017-09-05 MED ORDER — DEXAMETHASONE SODIUM PHOSPHATE 4 MG/ML IJ SOLN
INTRAMUSCULAR | Status: DC | PRN
  Administered 2017-09-05: 10 mg via INTRAVENOUS

## 2017-09-05 MED ORDER — PROPOFOL 500 MG/50ML IV EMUL
INTRAVENOUS | Status: AC
Start: 2017-09-05 — End: ?
  Filled 2017-09-05: qty 100

## 2017-09-05 MED ORDER — CEFAZOLIN SODIUM-DEXTROSE 2-4 GM/100ML-% IV SOLN
2.0000 g | INTRAVENOUS | Status: AC
  Administered 2017-09-05: 2 g via INTRAVENOUS

## 2017-09-05 MED ORDER — LACTATED RINGERS IV SOLN
INTRAVENOUS | Status: DC
  Administered 2017-09-05 (×2): via INTRAVENOUS

## 2017-09-05 MED ORDER — SCOPOLAMINE 1 MG/3DAYS TD PT72: 1.0000 | MEDICATED_PATCH | Freq: Once | TRANSDERMAL | Status: DC | PRN

## 2017-09-05 MED ORDER — CHLORHEXIDINE GLUCONATE CLOTH 2 % EX PADS: 6.0000 | MEDICATED_PAD | Freq: Once | CUTANEOUS | Status: DC

## 2017-09-05 MED ORDER — GABAPENTIN 300 MG PO CAPS
300.0000 mg | ORAL_CAPSULE | ORAL | Status: AC
  Administered 2017-09-05: 300 mg via ORAL

## 2017-09-05 MED ORDER — CEFAZOLIN SODIUM-DEXTROSE 2-4 GM/100ML-% IV SOLN
INTRAVENOUS | Status: AC
  Filled 2017-09-05: qty 100

## 2017-09-05 MED ORDER — BUPIVACAINE-EPINEPHRINE (PF) 0.25% -1:200000 IJ SOLN
INTRAMUSCULAR | Status: DC | PRN
  Administered 2017-09-05: 10 mL via PERINEURAL

## 2017-09-05 MED ORDER — ONDANSETRON HCL 4 MG/2ML IJ SOLN
INTRAMUSCULAR | Status: DC | PRN
  Administered 2017-09-05: 4 mg via INTRAVENOUS

## 2017-09-05 MED ORDER — FENTANYL CITRATE (PF) 100 MCG/2ML IJ SOLN
50.0000 ug | INTRAMUSCULAR | Status: DC | PRN
  Administered 2017-09-05: 100 ug via INTRAVENOUS

## 2017-09-05 MED ORDER — CELECOXIB 200 MG PO CAPS
ORAL_CAPSULE | ORAL | Status: AC
  Filled 2017-09-05: qty 1

## 2017-09-05 MED ORDER — CELECOXIB 200 MG PO CAPS
200.0000 mg | ORAL_CAPSULE | ORAL | Status: AC
  Administered 2017-09-05: 200 mg via ORAL

## 2017-09-05 MED ORDER — FENTANYL CITRATE (PF) 100 MCG/2ML IJ SOLN
INTRAMUSCULAR | Status: AC
  Filled 2017-09-05: qty 2

## 2017-09-05 MED ORDER — PROPOFOL 10 MG/ML IV BOLUS
INTRAVENOUS | Status: DC | PRN
  Administered 2017-09-05: 200 mg via INTRAVENOUS

## 2017-09-05 MED ORDER — EPHEDRINE SULFATE 50 MG/ML IJ SOLN
INTRAMUSCULAR | Status: DC | PRN
  Administered 2017-09-05: 10 mg via INTRAVENOUS

## 2017-09-05 MED ORDER — HYDROCODONE-ACETAMINOPHEN 5-325 MG PO TABS: 1.0000 | ORAL_TABLET | Freq: Four times a day (QID) | ORAL | 0 refills | Status: DC | PRN

## 2017-09-05 MED ORDER — LIDOCAINE HCL (CARDIAC) PF 100 MG/5ML IV SOSY
PREFILLED_SYRINGE | INTRAVENOUS | Status: DC | PRN
  Administered 2017-09-05: 60 mg via INTRAVENOUS

## 2017-09-05 MED ORDER — GABAPENTIN 300 MG PO CAPS
ORAL_CAPSULE | ORAL | Status: AC
  Filled 2017-09-05: qty 1

## 2017-09-05 MED ORDER — MIDAZOLAM HCL 2 MG/2ML IJ SOLN
1.0000 mg | INTRAMUSCULAR | Status: DC | PRN
  Administered 2017-09-05: 2 mg via INTRAVENOUS

## 2017-09-05 SURGICAL SUPPLY — 40 items
APPLIER CLIP 9.375 MED OPEN (MISCELLANEOUS)
BLADE SURG 15 STRL LF DISP TIS (BLADE) ×1 IMPLANT
BLADE SURG 15 STRL SS (BLADE) ×1
CANISTER SUC SOCK COL 7IN (MISCELLANEOUS) IMPLANT
CANISTER SUCT 1200ML W/VALVE (MISCELLANEOUS) IMPLANT
CHLORAPREP W/TINT 26ML (MISCELLANEOUS) ×2 IMPLANT
CLIP APPLIE 9.375 MED OPEN (MISCELLANEOUS) IMPLANT
COVER BACK TABLE 60X90IN (DRAPES) ×2 IMPLANT
COVER MAYO STAND STRL (DRAPES) ×2 IMPLANT
COVER PROBE W GEL 5X96 (DRAPES) ×2 IMPLANT
DECANTER SPIKE VIAL GLASS SM (MISCELLANEOUS) ×2 IMPLANT
DERMABOND ADVANCED (GAUZE/BANDAGES/DRESSINGS) ×1
DERMABOND ADVANCED .7 DNX12 (GAUZE/BANDAGES/DRESSINGS) ×1 IMPLANT
DEVICE DUBIN W/COMP PLATE 8390 (MISCELLANEOUS) ×2 IMPLANT
DRAPE LAPAROSCOPIC ABDOMINAL (DRAPES) ×2 IMPLANT
DRAPE UTILITY XL STRL (DRAPES) ×2 IMPLANT
ELECT COATED BLADE 2.86 ST (ELECTRODE) ×2 IMPLANT
ELECT REM PT RETURN 9FT ADLT (ELECTROSURGICAL) ×2
ELECTRODE REM PT RTRN 9FT ADLT (ELECTROSURGICAL) ×1 IMPLANT
GLOVE BIO SURGEON STRL SZ 6.5 (GLOVE) ×2 IMPLANT
GLOVE BIO SURGEON STRL SZ7.5 (GLOVE) ×4 IMPLANT
GOWN STRL REUS W/ TWL LRG LVL3 (GOWN DISPOSABLE) ×2 IMPLANT
GOWN STRL REUS W/TWL LRG LVL3 (GOWN DISPOSABLE) ×2
ILLUMINATOR WAVEGUIDE N/F (MISCELLANEOUS) IMPLANT
KIT MARKER MARGIN INK (KITS) ×2 IMPLANT
LIGHT WAVEGUIDE WIDE FLAT (MISCELLANEOUS) IMPLANT
NEEDLE HYPO 25X1 1.5 SAFETY (NEEDLE) ×2 IMPLANT
NS IRRIG 1000ML POUR BTL (IV SOLUTION) IMPLANT
PACK BASIN DAY SURGERY FS (CUSTOM PROCEDURE TRAY) ×2 IMPLANT
PENCIL BUTTON HOLSTER BLD 10FT (ELECTRODE) ×2 IMPLANT
SLEEVE SCD COMPRESS KNEE MED (MISCELLANEOUS) ×2 IMPLANT
SPONGE LAP 18X18 RF (DISPOSABLE) IMPLANT
SUT MON AB 4-0 PC3 18 (SUTURE) ×2 IMPLANT
SUT SILK 2 0 SH (SUTURE) IMPLANT
SUT VICRYL 3-0 CR8 SH (SUTURE) ×2 IMPLANT
SYR CONTROL 10ML LL (SYRINGE) ×2 IMPLANT
TOWEL GREEN STERILE FF (TOWEL DISPOSABLE) ×2 IMPLANT
TOWEL OR NON WOVEN STRL DISP B (DISPOSABLE) ×2 IMPLANT
TUBE CONNECTING 20X1/4 (TUBING) ×2 IMPLANT
YANKAUER SUCT BULB TIP NO VENT (SUCTIONS) IMPLANT

## 2017-09-05 NOTE — Anesthesia Procedure Notes (Signed)
Procedure Name: LMA Insertion Date/Time: 09/05/2017 2:44 PM Performed by: Signe Colt, CRNA Pre-anesthesia Checklist: Patient identified, Emergency Drugs available, Suction available and Patient being monitored Patient Re-evaluated:Patient Re-evaluated prior to induction Oxygen Delivery Method: Circle system utilized Preoxygenation: Pre-oxygenation with 100% oxygen Induction Type: IV induction Ventilation: Mask ventilation without difficulty LMA: LMA inserted LMA Size: 4.0 Number of attempts: 1 Airway Equipment and Method: Bite block Placement Confirmation: positive ETCO2 Tube secured with: Tape Dental Injury: Teeth and Oropharynx as per pre-operative assessment

## 2017-09-05 NOTE — Discharge Instructions (Signed)
No ibuprofen until 6:45pm   Post Anesthesia Home Care Instructions  Activity: Get plenty of rest for the remainder of the day. A responsible individual must stay with you for 24 hours following the procedure.  For the next 24 hours, DO NOT: -Drive a car -Paediatric nurse -Drink alcoholic beverages -Take any medication unless instructed by your physician -Make any legal decisions or sign important papers.  Meals: Start with liquid foods such as gelatin or soup. Progress to regular foods as tolerated. Avoid greasy, spicy, heavy foods. If nausea and/or vomiting occur, drink only clear liquids until the nausea and/or vomiting subsides. Call your physician if vomiting continues.  Special Instructions/Symptoms: Your throat may feel dry or sore from the anesthesia or the breathing tube placed in your throat during surgery. If this causes discomfort, gargle with warm salt water. The discomfort should disappear within 24 hours.  If you had a scopolamine patch placed behind your ear for the management of post- operative nausea and/or vomiting:  1. The medication in the patch is effective for 72 hours, after which it should be removed.  Wrap patch in a tissue and discard in the trash. Wash hands thoroughly with soap and water. 2. You may remove the patch earlier than 72 hours if you experience unpleasant side effects which may include dry mouth, dizziness or visual disturbances. 3. Avoid touching the patch. Wash your hands with soap and water after contact with the patch.

## 2017-09-05 NOTE — H&P (Signed)
[name]  Location: [facility] Patient #: [id number] DOB: 1975/10/21 [name] / Race: Black or African American Female   History of Present Illness  The patient is a 42 year old female who presents with a breast mass. We are asked see the patient in consultation by Dr. 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 The patient is a 42 year old black female who recently went for her first screening mammogram. At that time she was found to have a 9 mm mass in the upper portion of the right breast. She denied any breast pain or discharge from the nipple. 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 She has no family history of breast cancer. She does not smoke.   Past Surgical History  Cesarean Section - Multiple  Colon Polyp Removal - Colonoscopy  Hysterectomy (not due to cancer) - Partial  Oral Surgery   Diagnostic Studies History  Colonoscopy  1-5 years ago Mammogram  within last year Pap Smear  1-5 years ago  Allergies  Percocet *ANALGESICS - OPIOID*  TraMADol HCl *ANALGESICS - OPIOID*  Allergies Reconciled   Medication History  No Current Medications Medications Reconciled  Social History Alcohol use  Occasional alcohol use. No drug use  Tobacco use  Former smoker.  Family History  Depression  Daughter, Mother, Son. Migraine Headache  Father.  Pregnancy / Birth History  Age at menarche  54 years. Contraceptive History  Depo-provera, Oral contraceptives. Gravida  3 Irregular periods  Maternal age  43-20 Para  4  Other Problems  Anxiety Disorder  Back Pain  Depression     Review of Systems  General Not Present- Appetite Loss, Chills, Fatigue, Fever, Night Sweats, Weight Gain and Weight Loss. Skin Not Present- Change in Wart/Mole, Dryness, Hives, Jaundice, New Lesions, Non-Healing Wounds, Rash and Ulcer. HEENT Present- Wears glasses/contact  lenses. Not Present- Earache, Hearing Loss, Hoarseness, Nose Bleed, Oral Ulcers, Ringing in the Ears, Seasonal Allergies, Sinus Pain, Sore Throat, Visual Disturbances and Yellow Eyes. Respiratory Present- Snoring. Not Present- Bloody sputum, Chronic Cough, Difficulty Breathing and Wheezing. Cardiovascular Not Present- Chest Pain, Difficulty Breathing Lying Down, Leg Cramps, Palpitations, Rapid Heart Rate, Shortness of Breath and Swelling of Extremities. Gastrointestinal Not Present- Abdominal Pain, Bloating, Bloody Stool, Change in Bowel Habits, Chronic diarrhea, Constipation, Difficulty Swallowing, Excessive gas, Gets full quickly at meals, Hemorrhoids, Indigestion, Nausea, Rectal Pain and Vomiting. Female Genitourinary Not Present- Frequency, Nocturia, Painful Urination, Pelvic Pain and Urgency. Neurological Not Present- Decreased Memory, Fainting, Headaches, Numbness, Seizures, Tingling, Tremor, Trouble walking and Weakness. Endocrine Not Present- Cold Intolerance, Excessive Hunger, Hair Changes, Heat Intolerance, Hot flashes and New Diabetes. Hematology Not Present- Blood Thinners, Easy Bruising, Excessive bleeding, Gland problems, HIV and Persistent Infections.  Vitals Weight: 164 lb Height: 62in Body Surface Area: 1.76 m Body Mass Index: 30 kg/m  Temp.: 98.69F(Oral)  Pulse: 90 (Regular)  BP: 128/80 (Sitting, Left Arm, Standard)       Physical Exam  General Mental Status-Alert. General Appearance-Consistent with stated age. Hydration-Well hydrated. Voice-Normal.  Head and Neck Head-normocephalic, atraumatic with no lesions or palpable masses. Trachea-midline. Thyroid Gland Characteristics - normal size and consistency.  Eye Eyeball - Bilateral-Extraocular movements intact. Sclera/Conjunctiva - Bilateral-No scleral icterus.  Chest and Lung Exam Chest and lung exam reveals -quiet, even and easy respiratory effort with no use of accessory  muscles and on auscultation, normal breath sounds, no adventitious sounds and normal vocal resonance. Inspection  Chest Wall - Normal. Back - normal.  Breast Note: There is a small area of vague palpable fullness in the far upper portion of the right breast. There is no palpable mass of the left breast. There is no palpable axillary, supraclavicular, or cervical lymphadenopathy. She has had hidradenitis removed from the right axilla.   Cardiovascular Cardiovascular examination reveals -normal heart sounds, regular rate and rhythm with no murmurs and normal pedal pulses bilaterally.  Abdomen Inspection Inspection of the abdomen reveals - No Hernias. Skin - Scar - no surgical scars. Palpation/Percussion Palpation and Percussion of the abdomen reveal - Soft, Non Tender, No Rebound tenderness, No Rigidity (guarding) and No hepatosplenomegaly. Auscultation Auscultation of the abdomen reveals - Bowel sounds normal.  Neurologic Neurologic evaluation reveals -alert and oriented x 3 with no impairment of recent or remote memory. Mental Status-Normal.  Musculoskeletal Normal Exam - Left-Upper Extremity Strength Normal and Lower Extremity Strength Normal. Normal Exam - Right-Upper Extremity Strength Normal and Lower Extremity Strength Normal.  Lymphatic Head & Neck  General Head & Neck Lymphatics: Bilateral - Description - Normal. Axillary  General Axillary Region: Bilateral - Description - Normal. Tenderness - Non Tender. Femoral & Inguinal  Generalized Femoral & Inguinal Lymphatics: Bilateral - Description - Normal. Tenderness - Non Tender.    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 Because of its similar appearance to breast cancer would recommend having this area removed. Once it is removed we will be able to tell more precisely whether this will behave in a  benign manner versus malignant. She will require a right breast radioactive seed localized lumpectomy. I have discussed with her in detail the risks and benefits of the operation as well as some of the technical aspects and she understands and wishes to proceed.

## 2017-09-05 NOTE — Anesthesia Postprocedure Evaluation (Signed)
Anesthesia Post Note  Patient: [name]  Procedure(s) Performed: BREAST LUMPECTOMY WITH RADIOACTIVE SEED LOCALIZATION (Right Breast)     Patient location during evaluation: PACU Anesthesia Type: General Level of consciousness: awake Pain management: pain level controlled Vital Signs Assessment: post-procedure vital signs reviewed and stable Respiratory status: spontaneous breathing Cardiovascular status: stable Postop Assessment: no headache Anesthetic complications: no    Last Vitals:  Vitals:   09/05/17 1230 09/05/17 1300  BP: 102/61 140/78  Pulse: 69 84  Resp: 16 (!) 23  Temp: 36.4 C   SpO2: 100% 100%    Last Pain:  Vitals:   09/05/17 1300  TempSrc:   PainSc: 0-No pain                 Arbie Reisz

## 2017-09-05 NOTE — Anesthesia Preprocedure Evaluation (Addendum)
Anesthesia Evaluation  Patient identified by MRN, date of birth, ID band Patient awake    Reviewed: Allergy & Precautions, H&P , NPO status , Patient's Chart, lab work & pertinent test results, reviewed documented beta blocker date and time   Airway Mallampati: II  TM Distance: >3 FB Neck ROM: full    Dental   Pulmonary sleep apnea , former smoker,    breath sounds clear to auscultation       Cardiovascular  Rhythm:Regular Rate:Normal     Neuro/Psych  Headaches, Anxiety Depression  Neuromuscular disease    GI/Hepatic   Endo/Other  Morbid obesity  Renal/GU      Musculoskeletal   Abdominal   Peds  Hematology   Anesthesia Other Findings See surgeon's H&P   Reproductive/Obstetrics                            Anesthesia Physical  Anesthesia Plan  ASA: III  Anesthesia Plan: General   Post-op Pain Management:    Induction: Intravenous  PONV Risk Score and Plan: 3 and Ondansetron, Dexamethasone and Treatment may vary due to age or medical condition  Airway Management Planned: LMA and Oral ETT  Additional Equipment:   Intra-op Plan:   Post-operative Plan: Extubation in OR  Informed Consent: I have reviewed the patients History and Physical, chart, labs and discussed the procedure including the risks, benefits and alternatives for the proposed anesthesia with the patient or authorized representative who has indicated his/her understanding and acceptance.     Plan Discussed with: CRNA and Surgeon  Anesthesia Plan Comments:         Anesthesia Quick Evaluation

## 2017-09-05 NOTE — Op Note (Signed)
09/05/2017  12:24 PM  PATIENT:  [name]  42 y.o. female  PRE-OPERATIVE DIAGNOSIS:  GRANULAR CELL TUMOR RIGHT BREAST  POST-OPERATIVE DIAGNOSIS:  GRANULAR CELL TUMOR RIGHT BREAST  PROCEDURE:  Procedure(s): BREAST LUMPECTOMY WITH RADIOACTIVE SEED LOCALIZATION (Right)  SURGEON:  Surgeon(s) and Role:    * Jovita Kussmaul, MD - Primary  PHYSICIAN ASSISTANT:   ASSISTANTS: none   ANESTHESIA:   local and general  EBL:  minimal   BLOOD ADMINISTERED:none  DRAINS: none   LOCAL MEDICATIONS USED:  MARCAINE     SPECIMEN:  Source of Specimen:  right breast tissue  DISPOSITION OF SPECIMEN:  PATHOLOGY  COUNTS:  YES  TOURNIQUET:  * No tourniquets in log *  DICTATION: .Dragon Dictation   After informed consent was obtained the patient was brought to the operating room and placed in the supine position on the operating table.  After adequate induction of general anesthesia the patient's right breast was prepped with ChloraPrep, allowed to dry, and draped in usual sterile manner.  An appropriate timeout was performed.  Previously an I-125 seed was placed in the upper outer aspect of the right breast to mark an area of a granular cell tumor.  The neoprobe was set to I-125 in the area of radioactivity was readily identified far in the upper outer quadrant.  The area around this was infiltrated with quarter percent Marcaine.  A small incision was made in the axilla nearest to where the radioactivity was with a 15 blade knife.  The incision was carried through the skin and subcutaneous tissue sharply with the electrocautery until the pectoralis muscle was identified.  The dissection was carried along the pectoralis muscle until the dissection was well beyond the area of radioactivity.  Next a wedge of breast tissue was excised sharply with the electrocautery around the radioactive seed while checking the area of radioactivity frequently.  Once this was accomplished the specimen was then marked  with the appropriate paint colors.  A specimen radiograph was obtained that showed the clip and seed to be near the center of the specimen.  The specimen was then sent to pathology for further evaluation.  Hemostasis was achieved using the Bovie electrocautery.  The wound was irrigated with saline and infiltrated with more quarter percent Marcaine.  No palpable abnormality could be appreciated in the general area of the dissection.  The deep layer of the wound was then closed with layers of interrupted 3-0 Vicryl stitches.  The skin was then closed with a running 4-0 Monocryl subcuticular stitch.  Dermabond dressings were applied.  The patient tolerated the procedure well.  At the end of the case all needle sponge and instrument counts were correct.  The patient was then awakened and taken to recovery in stable condition.  PLAN OF CARE: Discharge to home after PACU  PATIENT DISPOSITION:  PACU - hemodynamically stable.   Delay start of Pharmacological VTE agent (>24hrs) due to surgical blood loss or risk of bleeding: not applicable

## 2017-09-05 NOTE — Transfer of Care (Signed)
Immediate Anesthesia Transfer of Care Note  Patient: [name]  Procedure(s) Performed: BREAST LUMPECTOMY WITH RADIOACTIVE SEED LOCALIZATION (Right Breast)  Patient Location: PACU  Anesthesia Type:General  Level of Consciousness: awake and patient cooperative  Airway & Oxygen Therapy: Patient Spontanous Breathing and Patient connected to face mask oxygen  Post-op Assessment: Report given to RN and Post -op Vital signs reviewed and stable  Post vital signs: Reviewed and stable  Last Vitals:  Vitals Value Taken Time  BP    Temp    Pulse 69 09/05/2017 12:30 PM  Resp 16 09/05/2017 12:30 PM  SpO2 100 % 09/05/2017 12:30 PM  Vitals shown include unvalidated device data.  Last Pain:  Vitals:   09/05/17 1026  TempSrc: Oral         Complications: No apparent anesthesia complications

## 2017-09-05 NOTE — Interval H&P Note (Signed)
History and Physical Interval Note:  09/05/2017 11:29 AM  [name]  has presented today for surgery, with the diagnosis of GRANULAR CELL TUMOR RIGHT BREAST  The various methods of treatment have been discussed with the patient and family. After consideration of risks, benefits and other options for treatment, the patient has consented to  Procedure(s): BREAST LUMPECTOMY WITH RADIOACTIVE SEED LOCALIZATION (Right) as a surgical intervention .  The patient's history has been reviewed, patient examined, no change in status, stable for surgery.  I have reviewed the patient's chart and labs.  Questions were answered to the patient's satisfaction.     TOTH III,PAUL S

## 2017-09-08 ENCOUNTER — Encounter (HOSPITAL_BASED_OUTPATIENT_CLINIC_OR_DEPARTMENT_OTHER): Payer: Self-pay | Admitting: General Surgery

## 2017-09-15 ENCOUNTER — Ambulatory Visit (INDEPENDENT_AMBULATORY_CARE_PROVIDER_SITE_OTHER): Payer: Medicaid Other

## 2017-09-15 ENCOUNTER — Other Ambulatory Visit (INDEPENDENT_AMBULATORY_CARE_PROVIDER_SITE_OTHER): Payer: Self-pay | Admitting: Radiology

## 2017-09-15 ENCOUNTER — Encounter (INDEPENDENT_AMBULATORY_CARE_PROVIDER_SITE_OTHER): Payer: Self-pay | Admitting: Orthopaedic Surgery

## 2017-09-15 ENCOUNTER — Ambulatory Visit (INDEPENDENT_AMBULATORY_CARE_PROVIDER_SITE_OTHER): Payer: Medicaid Other | Admitting: Orthopaedic Surgery

## 2017-09-15 DIAGNOSIS — G8929 Other chronic pain: Secondary | ICD-10-CM

## 2017-09-15 DIAGNOSIS — M545 Low back pain, unspecified: Secondary | ICD-10-CM

## 2017-09-15 DIAGNOSIS — M25561 Pain in right knee: Secondary | ICD-10-CM

## 2017-09-15 DIAGNOSIS — M542 Cervicalgia: Secondary | ICD-10-CM | POA: Diagnosis not present

## 2017-09-15 DIAGNOSIS — M25562 Pain in left knee: Principal | ICD-10-CM
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
## 2017-09-22 ENCOUNTER — Other Ambulatory Visit: Payer: Self-pay

## 2017-09-22 ENCOUNTER — Ambulatory Visit: Payer: Medicaid Other | Attending: Orthopaedic Surgery | Admitting: Physical Therapy

## 2017-09-22 DIAGNOSIS — G8929 Other chronic pain: Secondary | ICD-10-CM | POA: Diagnosis present

## 2017-09-22 DIAGNOSIS — M542 Cervicalgia: Secondary | ICD-10-CM | POA: Diagnosis present

## 2017-09-22 DIAGNOSIS — M25562 Pain in left knee: Secondary | ICD-10-CM | POA: Insufficient documentation

## 2017-09-22 DIAGNOSIS — M545 Low back pain: Secondary | ICD-10-CM | POA: Insufficient documentation

## 2017-09-22 DIAGNOSIS — M25561 Pain in right knee: Secondary | ICD-10-CM | POA: Insufficient documentation
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
## 2017-09-30 ENCOUNTER — Ambulatory Visit: Payer: Medicaid Other | Admitting: Physical Therapy

## 2017-09-30 DIAGNOSIS — M25561 Pain in right knee: Secondary | ICD-10-CM

## 2017-09-30 DIAGNOSIS — M25562 Pain in left knee: Secondary | ICD-10-CM

## 2017-09-30 DIAGNOSIS — M545 Low back pain: Secondary | ICD-10-CM

## 2017-09-30 DIAGNOSIS — G8929 Other chronic pain: Secondary | ICD-10-CM

## 2017-09-30 NOTE — Therapy (Signed)
[place], [place], [address] Phone: [phone]   Fax:  [phone]  Physical Therapy Treatment  Patient Details  Name: [name] MRN: [id number] Date of Birth: 17-Feb-1975 Referring Provider: Joni Fears, MD   Encounter Date: 09/30/2017  PT End of Session - 09/30/17 0850    Visit Number  2    Number of Visits  4    Date for PT Re-Evaluation  10/20/17    Authorization Type  MCD    PT Start Time  0851    PT Stop Time  0943    PT Time Calculation (min)  52 min    Activity Tolerance  Patient tolerated treatment well    Behavior During Therapy  [facility] for tasks assessed/performed       Past Medical History:  Diagnosis Date  . ADHD (attention deficit hyperactivity disorder)   . Anxiety   . Carpal tunnel syndrome on both sides 04/2011  . Chronic pain    shoulders, arms, hands  . De Quervain's tenosynovitis, left 04/2011  . Depression   . Depression   . Environmental allergies   . Headache   . Hx MRSA infection 2009  . Irregular heartbeat 03/2011   determined to be palpitations, per Dr Irven Shelling office note  . Sleep apnea    mild sleep apnea, no CPAP    Past Surgical History:  Procedure Laterality Date  . ABDOMINAL HYSTERECTOMY     partial  . BREAST LUMPECTOMY WITH RADIOACTIVE SEED LOCALIZATION Right 09/05/2017   Procedure: BREAST LUMPECTOMY WITH RADIOACTIVE SEED LOCALIZATION;  Surgeon: Jovita Kussmaul, MD;  Location: [place];  Service: General;  Laterality: Right;  . CARPAL TUNNEL RELEASE  04/30/2011   Procedure: CARPAL TUNNEL RELEASE;  Surgeon: Cammie Sickle., MD;  Location: [place];  Service: Orthopedics;  Laterality: Left;  . CESAREAN SECTION     x 3  . DORSAL COMPARTMENT RELEASE  04/30/2011   Procedure: RELEASE DORSAL COMPARTMENT (DEQUERVAIN);  Surgeon: Cammie Sickle., MD;  [facility];  Service: Orthopedics;   Laterality: Left;    There were no vitals filed for this visit.  Subjective Assessment - 09/30/17 0851    Subjective  Patient reports 4-5/10 pain in left knee.    Pertinent History  recent lumpectomy 09/05/17    How long can you sit comfortably?  10 - 15 min due to back    How long can you stand comfortably?  10-15 min due to back    Diagnostic tests  xrays negative bil    Patient Stated Goals  decrease pain increase mobility    Currently in Pain?  Yes    Pain Score  4     Pain Location  Knee    Pain Orientation  Left    Pain Descriptors / Indicators  Aching    Pain Type  Acute pain    Pain Score  5    Pain Location  Back    Pain Orientation  Right;Left;Lower    Pain Descriptors / Indicators  Sharp;Dull    Pain Type  Chronic pain                       [facility] Adult PT Treatment/Exercise - 09/30/17 0001      Exercises   Exercises  Knee/Hip      Knee/Hip Exercises: Stretches   Active Hamstring Stretch  Left;2 reps;60 seconds    Active Hamstring Stretch Limitations  with strap    Quad Stretch  Left;2 reps;60 seconds    Quad Stretch Limitations  with strap    ITB Stretch  Left;2 reps;60 seconds   with strap   Other Knee/Hip Stretches  foam roller for quads and L ITB      Knee/Hip Exercises: Aerobic   Nustep  L3 x 5 min      Knee/Hip Exercises: Supine   Straight Leg Raises  Strengthening;Left;3 sets;10 reps      Knee/Hip Exercises: Sidelying   Hip ABduction  Strengthening;Left;5 sets;5 reps      Modalities   Modalities  Electrical Stimulation;Moist Heat      Moist Heat Therapy   Number Minutes Moist Heat  15 Minutes    Moist Heat Location  Lumbar Spine      Electrical Stimulation   Electrical Stimulation Location  lumbar    Electrical Stimulation Action  IFC 80-150 Hz x 15 min    Electrical Stimulation Goals  Pain      Manual Therapy   Manual Therapy  Myofascial release;Soft tissue mobilization    Soft tissue mobilization  to left lateral quad and  lateral HS    Myofascial Release  to Left ITB             PT Education - 09/30/17 0934    Education Details  HEP    Person(s) Educated  Patient    Methods  Explanation;Demonstration;Handout    Comprehension  Verbalized understanding;Returned demonstration          PT Long Term Goals - 09/22/17 1003      PT LONG TERM GOAL #1   Title  I with HEP for strengthening and flexibiltiy    Status  New    Target Date  11/17/17      PT LONG TERM GOAL #2   Title  Patient to report decreased knee pain by 50% or more with ADLs.    Status  New    Target Date  11/17/17      PT LONG TERM GOAL #3   Title  Patient to report decreased back pain by 50% or more with standing and walking.    Status  New    Target Date  11/17/17      PT LONG TERM GOAL #4   Title  Patient to demo R cervical rotation equal to left to improve function.    Target Date  11/17/17            Plan - 09/30/17 6269    Clinical Impression Statement  Patient did very well with treatment today. She is very tight in her left ITB and quads.    PT Treatment/Interventions  Cryotherapy;Electrical Stimulation;Iontophoresis 4mg /ml Dexamethasone;Moist Heat;Traction;Ultrasound;Therapeutic exercise;Patient/family education;Manual techniques;Dry needling;Taping    PT Next Visit Plan  pain free knee strengthening, manual to L quad/itb       Patient will benefit from skilled therapeutic intervention in order to improve the following deficits and impairments:  Decreased range of motion, Impaired flexibility, Postural dysfunction, Decreased strength, Pain  Visit Diagnosis: Acute pain of left knee  Acute pain of right knee  Chronic bilateral low back pain, with sciatica presence unspecified     Problem List There are no active problems to display for this patient.   Katalena Malveaux PT 09/30/2017, 9:47 AM  [place], [place],  [address] Phone:  [phone]   Fax:  [phone]  Name: [name] MRN: [id number] Date of Birth: 04-07-1975

## 2017-09-30 NOTE — Patient Instructions (Signed)
  Hamstring Stretch, Reclined (Strap, Doorframe)   Lengthen bottom leg on floor. Extend top leg along edge of doorframe or press foot up into yoga strap. Hold for 60 seconds. Repeat 3_ times each leg.  Piriformis (Supine)  Cross legs, right on top. Gently pull other knee toward chest until stretch is felt in buttock/hip of top leg. Hold _60___ seconds. Repeat _3___ times per set. Do ____ sets per session. Do __2__ sessions per day.   Quadriceps (Prone)   On stomach with sheet around ankles, knees together, hips down, pull heels toward bottom. Keep hips flat. Hold __60__ seconds. Repeat _3__ times. Do __3__ sessions per day. CAUTION: Stretch should be gentle, steady and slow.   Outer Hip Stretch: Reclined IT Band Stretch (Strap)   Strap around opposite foot, pull across only as far as possible with shoulders on mat. Hold for __6 60__ seconds. Repeat __3__ times each leg. 2-3 x/day.  Copyright  VHI. All rights reserved.   Madelyn Flavors, PT   Hip Flexion / Knee Extension: Straight-Leg Raise (Eccentric)   Lie on back. Lift leg with knee straight. Slowly lower leg for 3-5 seconds. _10__ reps per set, _1-3__ sets. 2x/day. Lower like elevator, stopping at each floor.   ABDUCTION: Side-Lying (Active)   Lie on rightt side, top leg straight. Raise top leg as far as possible. Complete 1-3__ sets of _10__ repetitions. Perform _2-3__ sessions per day.Madelyn Flavors, PT 09/30/17 9:28 AM

## 2017-10-02 ENCOUNTER — Ambulatory Visit: Payer: Medicaid Other | Admitting: Physical Therapy

## 2017-10-02 ENCOUNTER — Encounter: Payer: Self-pay | Admitting: Physical Therapy

## 2017-10-02 DIAGNOSIS — M545 Low back pain: Secondary | ICD-10-CM

## 2017-10-02 DIAGNOSIS — M25562 Pain in left knee: Secondary | ICD-10-CM | POA: Diagnosis not present

## 2017-10-02 DIAGNOSIS — G8929 Other chronic pain: Secondary | ICD-10-CM

## 2017-10-02 NOTE — Therapy (Signed)
[place], [place], [address] Phone: [phone]   Fax:  [phone]  Physical Therapy Treatment  Patient Details  Name: [name] MRN: [id number] Date of Birth: 14-Jun-1975 Referring Provider: Joni Fears, MD   Encounter Date: 10/02/2017  PT End of Session - 10/02/17 0924    Visit Number  3    Number of Visits  4    Date for PT Re-Evaluation  10/20/17    PT Start Time  0853    PT Stop Time  0945    PT Time Calculation (min)  52 min       Past Medical History:  Diagnosis Date  . ADHD (attention deficit hyperactivity disorder)   . Anxiety   . Carpal tunnel syndrome on both sides 04/2011  . Chronic pain    shoulders, arms, hands  . De Quervain's tenosynovitis, left 04/2011  . Depression   . Depression   . Environmental allergies   . Headache   . Hx MRSA infection 2009  . Irregular heartbeat 03/2011   determined to be palpitations, per Dr Irven Shelling office note  . Sleep apnea    mild sleep apnea, no CPAP    Past Surgical History:  Procedure Laterality Date  . ABDOMINAL HYSTERECTOMY     partial  . BREAST LUMPECTOMY WITH RADIOACTIVE SEED LOCALIZATION Right 09/05/2017   Procedure: BREAST LUMPECTOMY WITH RADIOACTIVE SEED LOCALIZATION;  Surgeon: Jovita Kussmaul, MD;  Location: [facility];  Service: General;  Laterality: Right;  . CARPAL TUNNEL RELEASE  04/30/2011   Procedure: CARPAL TUNNEL RELEASE;  Surgeon: Cammie Sickle., MD;  Location: [facility];  Service: Orthopedics;  Laterality: Left;  . CESAREAN SECTION     x 3  . DORSAL COMPARTMENT RELEASE  04/30/2011   Procedure: RELEASE DORSAL COMPARTMENT (DEQUERVAIN);  Surgeon: Cammie Sickle., MD;  Location: [facility];  Service: Orthopedics;  Laterality: Left;    There were no vitals filed for this visit.  Subjective Assessment - 10/02/17 0854    Subjective  8 min late. left knee and  back pain    Currently in Pain?  Yes    Pain Score  4                        [facility] Adult PT Treatment/Exercise - 10/02/17 0001      Exercises   Exercises  Knee/Hip;Lumbar      Lumbar Exercises: Supine   Bridge with Ball Squeeze  Compliant;15 reps;3 seconds    Bridge with clamshell  15 reps   green tband   Single Leg Bridge  Compliant;10 reps;3 seconds    Other Supine Lumbar Exercises  bridge, KTC and obl 15 each with ball      Knee/Hip Exercises: Standing   Other Standing Knee Exercises  hip 3 way 15 repa each BIL      Modalities   Modalities  Electrical Stimulation;Moist Heat      Moist Heat Therapy   Number Minutes Moist Heat  15 Minutes    Moist Heat Location  Lumbar Spine;Hip      Electrical Stimulation   Electrical Stimulation Location  LB and Left ITB    Electrical Stimulation Action  premod    Electrical Stimulation Goals  Pain      Manual Therapy   Manual Therapy  Myofascial release;Passive ROM    Soft tissue mobilization  left ITB    Myofascial Release  left ITB with rolling/stripping    Passive ROM  LE and trunk             PT Education - 10/02/17 0923    Education Details  ITB SL stretch with 5# ankle wt, red tband hip ex, info on ordering TENS    Person(s) Educated  Patient    Methods  Explanation;Demonstration;Handout    Comprehension  Verbalized understanding;Returned demonstration          PT Long Term Goals - 10/02/17 0900      PT LONG TERM GOAL #1   Title  I with HEP for strengthening and flexibiltiy    Status  Achieved      PT LONG TERM GOAL #2   Title  Patient to report decreased knee pain by 50% or more with ADLs.    Status  Partially Met      PT LONG TERM GOAL #3   Title  Patient to report decreased back pain by 50% or more with standing and walking.    Status  Partially Met      PT LONG TERM GOAL #4   Title  Patient to demo R cervical rotation equal to left to improve function.    Baseline  cerv ROM WFLs     Status  Achieved            Plan - 10/02/17 0925    Clinical Impression Statement  goals partial yet. increased HEP for strength and stretch. ITB very tight and tender with STW . TENS info issued    PT Treatment/Interventions  Cryotherapy;Electrical Stimulation;Iontophoresis '4mg'$ /ml Dexamethasone;Moist Heat;Traction;Ultrasound;Therapeutic exercise;Patient/family education;Manual techniques;Dry needling;Taping    PT Next Visit Plan  pain free knee strengthening, manual to L quad/itb D/C       Patient will benefit from skilled therapeutic intervention in order to improve the following deficits and impairments:  Decreased range of motion, Impaired flexibility, Postural dysfunction, Decreased strength, Pain  Visit Diagnosis: Acute pain of left knee  Chronic bilateral low back pain, with sciatica presence unspecified     Problem List There are no active problems to display for this patient.   Tinsley Lomas,ANGIE  PTA 10/02/2017, 9:26 AM  [place], [place], [address] Phone: [phone]   Fax:  [phone]  Name: [name] MRN: [id number] Date of Birth: 07/17/1975

## 2017-10-07 ENCOUNTER — Ambulatory Visit: Payer: Medicaid Other | Attending: Orthopaedic Surgery | Admitting: Physical Therapy

## 2017-10-07 DIAGNOSIS — M25562 Pain in left knee: Secondary | ICD-10-CM | POA: Diagnosis present

## 2017-10-07 DIAGNOSIS — M545 Low back pain: Secondary | ICD-10-CM | POA: Insufficient documentation

## 2017-10-07 DIAGNOSIS — G8929 Other chronic pain: Secondary | ICD-10-CM | POA: Insufficient documentation

## 2017-10-07 NOTE — Therapy (Signed)
[place], [place], [address] Phone: [phone]   Fax:  [phone]  Physical Therapy Treatment  Patient Details  Name: [name] MRN: [id number] Date of Birth: 1975-05-24 Referring Provider: Joni Fears, MD   Encounter Date: 10/07/2017  PT End of Session - 10/07/17 0938    Visit Number  4    Number of Visits  4    Date for PT Re-Evaluation  10/20/17    Authorization Type  MCD    PT Start Time  0935    PT Stop Time  1013    PT Time Calculation (min)  38 min    Activity Tolerance  Patient tolerated treatment well    Behavior During Therapy  [place] for tasks assessed/performed       Past Medical History:  Diagnosis Date  . ADHD (attention deficit hyperactivity disorder)   . Anxiety   . Carpal tunnel syndrome on both sides 04/2011  . Chronic pain    shoulders, arms, hands  . De Quervain's tenosynovitis, left 04/2011  . Depression   . Depression   . Environmental allergies   . Headache   . Hx MRSA infection 2009  . Irregular heartbeat 03/2011   determined to be palpitations, per Dr Irven Shelling office note  . Sleep apnea    mild sleep apnea, no CPAP    Past Surgical History:  Procedure Laterality Date  . ABDOMINAL HYSTERECTOMY     partial  . BREAST LUMPECTOMY WITH RADIOACTIVE SEED LOCALIZATION Right 09/05/2017   Procedure: BREAST LUMPECTOMY WITH RADIOACTIVE SEED LOCALIZATION;  Surgeon: Jovita Kussmaul, MD;  Location: [facility];  Service: General;  Laterality: Right;  . CARPAL TUNNEL RELEASE  04/30/2011   Procedure: CARPAL TUNNEL RELEASE;  Surgeon: Cammie Sickle., MD;  Location: [facility];  Service: Orthopedics;  Laterality: Left;  . CESAREAN SECTION     x 3  . DORSAL COMPARTMENT RELEASE  04/30/2011   Procedure: RELEASE DORSAL COMPARTMENT (DEQUERVAIN);  Surgeon: Cammie Sickle., MD;  Location: [facility];  Service: Orthopedics;   Laterality: Left;    There were no vitals filed for this visit.  Subjective Assessment - 10/07/17 0939    Subjective  Patient reports left knee is feeling better overall. The back and hip are really hurting today.    Pertinent History  recent lumpectomy 09/05/17    Patient Stated Goals  decrease pain increase mobility    Currently in Pain?  Yes    Pain Score  0-No pain    Pain Score  5    Pain Location  Back                       [facility] Adult PT Treatment/Exercise - 10/07/17 0001      Self-Care   Self-Care  Other Self-Care Comments    Other Self-Care Comments   discussed need for self MFR, compliance with HEP      Exercises   Exercises  Knee/Hip;Lumbar      Lumbar Exercises: Supine   Bridge with Ball Squeeze  Compliant;15 reps;3 seconds    Bridge with clamshell  15 reps   2nd set with green band   Single Leg Bridge  Compliant;10 reps;3 seconds    Other Supine Lumbar Exercises  bridge, KTC and obl 15 each with ball      Lumbar Exercises: Prone   Other Prone Lumbar  Exercises  plank elbows/toes max hold x 3      Knee/Hip Exercises: Aerobic   Stationary Bike  L2 x 5 min      Knee/Hip Exercises: Standing   Functional Squat  1 set;15 reps    Wall Squat  2 sets;10 seconds      Modalities   Modalities  --      Moist Heat Therapy   Number Minutes Moist Heat  --    Moist Heat Location  --      Electrical Stimulation   Electrical Stimulation Location  --    Electrical Stimulation Action  --    Electrical Stimulation Goals  --      Manual Therapy   Manual Therapy  Myofascial release    Myofascial Release  left ITB with rolling/stripping; to gluts with roller and elbow                  PT Long Term Goals - 10/07/17 0950      PT LONG TERM GOAL #1   Title  I with HEP for strengthening and flexibiltiy    Status  Achieved      PT LONG TERM GOAL #2   Title  Patient to report decreased knee pain by 50% or more with ADLs.    Baseline  10-12%  improvement    Status  Partially Met      PT LONG TERM GOAL #3   Title  Patient to report decreased back pain by 50% or more with standing and walking.    Status  Not Met            Plan - 10/07/17 1015    Clinical Impression Statement  Patient reports improvement in L knee pain, but no significant change in low back pain. She continues to have TPs and tightness in L gluteals and in lateral L quads. She is encouraged by HEP and feels if she sticks with it, she should continue to make improvements.     Rehab Potential  Excellent    Clinical Impairments Affecting Rehab Potential  depression    PT Frequency  2x / week    PT Treatment/Interventions  Cryotherapy;Electrical Stimulation;Iontophoresis 70m/ml Dexamethasone;Moist Heat;Traction;Ultrasound;Therapeutic exercise;Patient/family education;Manual techniques;Dry needling;Taping    PT Next Visit Plan  see d/c summary    PT Home Exercise Plan  HS stretch, cerv and scap retraction; plank, wall squats, functional squats    Consulted and Agree with Plan of Care  Patient       Patient will benefit from skilled therapeutic intervention in order to improve the following deficits and impairments:  Decreased range of motion, Impaired flexibility, Postural dysfunction, Decreased strength, Pain  Visit Diagnosis: Acute pain of left knee  Chronic bilateral low back pain, with sciatica presence unspecified     Problem List There are no active problems to display for this patient.   Harlei Lehrmann PT 10/07/2017, 4:07 PM  [address] [address]Phone: [phone]  Fax:  [phone] Name: Jackie HEBERLEINMRN: [id number] of Birth: 3Sep 07, 1977 PHYSICAL THERAPY DISCHARGE SUMMARY  Visits from Start of Care: 4  Current functional level related to goals / functional outcomes: SEE ABOVE   Remaining deficits: SEE ABOVE   Education /  Equipment: HEP Plan: Patient agrees to discharge.  Patient goals were partially met. Patient is being discharged due to financial reasons.  ?????[facility]'s visit restricted by [facility]  requirements.    Madelyn Flavors, PT 10/07/17 4:10 PM; [address] [place], [place], [address] Phone: [phone]   Fax:  [phone]

## 2017-10-08 ENCOUNTER — Ambulatory Visit: Payer: Medicaid Other

## 2018-09-01 ENCOUNTER — Other Ambulatory Visit: Payer: Self-pay | Admitting: Physician Assistant

## 2018-09-01 DIAGNOSIS — Z1231 Encounter for screening mammogram for malignant neoplasm of breast: Secondary | ICD-10-CM

## 2018-10-14 ENCOUNTER — Other Ambulatory Visit: Payer: Self-pay

## 2018-10-14 ENCOUNTER — Ambulatory Visit
Admission: RE | Admit: 2018-10-14 | Discharge: 2018-10-14 | Disposition: A | Discharge status: Home / Self Care | Payer: Medicaid Other | Source: Ambulatory Visit | Attending: Physician Assistant | Admitting: Physician Assistant

## 2018-10-14 DIAGNOSIS — Z1231 Encounter for screening mammogram for malignant neoplasm of breast: Secondary | ICD-10-CM

## 2019-11-26 ENCOUNTER — Other Ambulatory Visit: Payer: Self-pay | Admitting: Physician Assistant

## 2019-11-26 DIAGNOSIS — Z1231 Encounter for screening mammogram for malignant neoplasm of breast: Secondary | ICD-10-CM

## 2020-01-05 ENCOUNTER — Ambulatory Visit: Payer: Medicaid Other

## 2020-02-14 ENCOUNTER — Ambulatory Visit: Payer: Medicaid Other

## 2020-03-28 ENCOUNTER — Ambulatory Visit
Admission: RE | Admit: 2020-03-28 | Discharge: 2020-03-28 | Disposition: A | Discharge status: Home / Self Care | Payer: Medicaid Other | Source: Ambulatory Visit | Attending: Physician Assistant | Admitting: Physician Assistant

## 2020-03-28 ENCOUNTER — Other Ambulatory Visit: Payer: Self-pay

## 2020-03-28 DIAGNOSIS — Z1231 Encounter for screening mammogram for malignant neoplasm of breast: Secondary | ICD-10-CM

## 2021-04-25 ENCOUNTER — Other Ambulatory Visit: Payer: Self-pay | Admitting: Physician Assistant

## 2021-04-25 DIAGNOSIS — Z1231 Encounter for screening mammogram for malignant neoplasm of breast: Secondary | ICD-10-CM

## 2021-05-24 ENCOUNTER — Ambulatory Visit
Admission: RE | Admit: 2021-05-24 | Discharge: 2021-05-24 | Disposition: A | Discharge status: Home / Self Care | Payer: Medicare Other | Source: Ambulatory Visit | Attending: Physician Assistant | Admitting: Physician Assistant

## 2021-05-24 DIAGNOSIS — Z1231 Encounter for screening mammogram for malignant neoplasm of breast: Secondary | ICD-10-CM

## 2021-09-03 ENCOUNTER — Encounter (HOSPITAL_BASED_OUTPATIENT_CLINIC_OR_DEPARTMENT_OTHER): Payer: Self-pay | Admitting: Emergency Medicine

## 2021-09-03 ENCOUNTER — Other Ambulatory Visit: Payer: Self-pay

## 2021-09-03 DIAGNOSIS — Z79899 Other long term (current) drug therapy: Secondary | ICD-10-CM | POA: Diagnosis not present

## 2021-09-03 DIAGNOSIS — Z7982 Long term (current) use of aspirin: Secondary | ICD-10-CM | POA: Diagnosis not present

## 2021-09-03 DIAGNOSIS — T63481A Toxic effect of venom of other arthropod, accidental (unintentional), initial encounter: Secondary | ICD-10-CM | POA: Diagnosis not present

## 2021-09-03 DIAGNOSIS — L500 Allergic urticaria: Secondary | ICD-10-CM | POA: Insufficient documentation

## 2021-09-03 DIAGNOSIS — R21 Rash and other nonspecific skin eruption: Secondary | ICD-10-CM | POA: Diagnosis present

## 2021-09-03 NOTE — ED Triage Notes (Signed)
Pt POV reports being stung on the stomach appx 1600 today, took benadryl at that time. Pt reports she now has rash on abdomen, hands, arms, face.  Large urticaria noted to abdomen.   Denies itching in throat, pt speaking in clear sentences. Denies ShOB.

## 2021-09-04 ENCOUNTER — Emergency Department (HOSPITAL_BASED_OUTPATIENT_CLINIC_OR_DEPARTMENT_OTHER)
Admission: EM | Admit: 2021-09-04 | Discharge: 2021-09-04 | Disposition: A | Discharge status: Home / Self Care | Payer: Medicare Other | Attending: Emergency Medicine | Admitting: Emergency Medicine

## 2021-09-04 DIAGNOSIS — L509 Urticaria, unspecified: Secondary | ICD-10-CM

## 2021-09-04 DIAGNOSIS — T63481A Toxic effect of venom of other arthropod, accidental (unintentional), initial encounter: Secondary | ICD-10-CM

## 2021-09-04 DIAGNOSIS — L500 Allergic urticaria: Secondary | ICD-10-CM | POA: Diagnosis not present

## 2021-09-04 MED ORDER — PREDNISONE 10 MG (21) PO TBPK: ORAL_TABLET | ORAL | 0 refills | Status: AC

## 2021-09-04 MED ORDER — PREDNISONE 10 MG PO TABS
60.0000 mg | ORAL_TABLET | Freq: Once | ORAL | Status: AC
  Administered 2021-09-04: 60 mg via ORAL
  Filled 2021-09-04: qty 1

## 2021-09-04 NOTE — ED Notes (Signed)
Patient reports stung by unknown insect. Reports itching and feeling like her tongue is swollen. Patient talking in complete sentences.

## 2021-09-04 NOTE — ED Provider Notes (Signed)
[facility]  Provider Note  CSN: [id number] Arrival date & time: 09/03/21 2230  History Chief Complaint  Patient presents with   Allergic Reaction    [name] is a 46 y.o. female reports she was stung by a small insect on her abdomen around 4pm, has had itchy rash to that area as well as on her arms, and back. She took some benadryl with minimal improvement. No difficulty breathing or swallowing.    Home Medications Prior to Admission medications   Medication Sig Start Date End Date Taking? Authorizing Provider  albuterol (PROAIR HFA) 108 (90 Base) MCG/ACT inhaler INHALE 2 PUFFS BY MOUTH INTO LUNGS EVERY 6 HOURS AS NEEDED FOR WHEEZING 07/19/21  Yes [provider]  amLODipine (NORVASC) 10 MG tablet Take by mouth. 11/02/20  Yes [provider]  atorvastatin (LIPITOR) 40 MG tablet Take by mouth. 10/27/20  Yes [provider]  busPIRone (BUSPAR) 10 MG tablet  07/19/21  Yes [provider]  ferrous sulfate (FEROSUL) 325 (65 FE) MG tablet Take 1 tablet by mouth daily with breakfast. 08/21/20  Yes [provider]  melatonin 3 MG TABS tablet Take by mouth. 07/19/20  Yes [provider]  predniSONE (STERAPRED UNI-PAK 21 TAB) 10 MG (21) TBPK tablet '10mg'$  Tabs, 6 day taper. Use as directed 09/04/21  Yes Truddie Hidden, MD  ARIPiprazole (ABILIFY) 5 MG tablet Take 5 mg by mouth daily. 05/20/21   [provider]  ASPIRIN LOW DOSE 81 MG tablet Take 81 mg by mouth daily. 03/28/21   [provider]  baclofen (LIORESAL) 10 MG tablet Take 10 mg by mouth 3 (three) times daily.    [provider]  dicyclomine (BENTYL) 10 MG capsule Take 10 mg by mouth 4 (four) times daily -  before meals and at bedtime.    [provider]  fluticasone (FLONASE) 50 MCG/ACT nasal spray Place into the nose.    [provider]  fluticasone (VERAMYST) 27.5 MCG/SPRAY nasal spray Place 2 sprays  into the nose daily. AM    [provider]  meloxicam (MOBIC) 7.5 MG tablet Take 7.5 mg by mouth daily.    [provider]  traZODone (DESYREL) 150 MG tablet Take 150 mg by mouth at bedtime. 03/28/21   [provider]     Allergies    Adhesive [tape], Darvocet [propoxyphene n-acetaminophen], Percocet [oxycodone-acetaminophen], and Tramadol   Review of Systems   Review of Systems Please see HPI for pertinent positives and negatives  Physical Exam BP 129/80 (BP Location: Right Arm)   Pulse 92   Temp 98.4 F (36.9 C) (Oral)   Resp 18   SpO2 100%   Physical Exam Vitals and nursing note reviewed.  Constitutional:      Appearance: Normal appearance.  HENT:     Head: Normocephalic and atraumatic.     Nose: Nose normal.     Mouth/Throat:     Mouth: Mucous membranes are moist.  Eyes:     Extraocular Movements: Extraocular movements intact.     Conjunctiva/sclera: Conjunctivae normal.  Cardiovascular:     Rate and Rhythm: Normal rate.  Pulmonary:     Effort: Pulmonary effort is normal.     Breath sounds: Normal breath sounds. No stridor.  Abdominal:     General: Abdomen is flat.     Palpations: Abdomen is soft.     Tenderness: There is no abdominal tenderness.  Musculoskeletal:  General: No swelling. Normal range of motion.     Cervical back: Neck supple.  Skin:    General: Skin is warm and dry.     Findings: Rash (urticaria to abdomen, BUE and lower back) present.  Neurological:     General: No focal deficit present.     Mental Status: She is alert.  Psychiatric:        Mood and Affect: Mood normal.     ED Results / Procedures / Treatments   EKG None  Procedures Procedures  Medications Ordered in the ED Medications  predniSONE (DELTASONE) tablet 60 mg (has no administration in time range)    Initial Impression and Plan  Patient with urticaria after insect sting. No signs of airway involvement. Will give oral prednisone,  continue benadryl for itching. RTED for any other concerns.   ED Course       MDM Rules/Calculators/A&P Medical Decision Making Problems Addressed: Insect stings, accidental or unintentional, initial encounter: acute illness or injury Urticaria: acute illness or injury  Risk Prescription drug management.    Final Clinical Impression(s) / ED Diagnoses Final diagnoses:  Urticaria  Insect stings, accidental or unintentional, initial encounter    Rx / DC Orders ED Discharge Orders          Ordered    predniSONE (STERAPRED UNI-PAK 21 TAB) 10 MG (21) TBPK tablet        09/04/21 0240             Truddie Hidden, MD 09/04/21 [phone]

## 2022-04-28 IMAGING — MG MM DIGITAL SCREENING BILAT W/ TOMO AND CAD
6 of 10 series · 6 of 30 positions shown · non-contrast
Comparison: Previous exam(s).

CLINICAL DATA: Screening.

EXAM:
DIGITAL SCREENING BILATERAL MAMMOGRAM WITH TOMOSYNTHESIS AND CAD
TECHNIQUE: Bilateral screening digital craniocaudal and mediolateral oblique
mammograms were obtained. Bilateral screening digital breast
tomosynthesis was performed. The images were evaluated with
computer-aided detection.

[L MLO synth-2D]
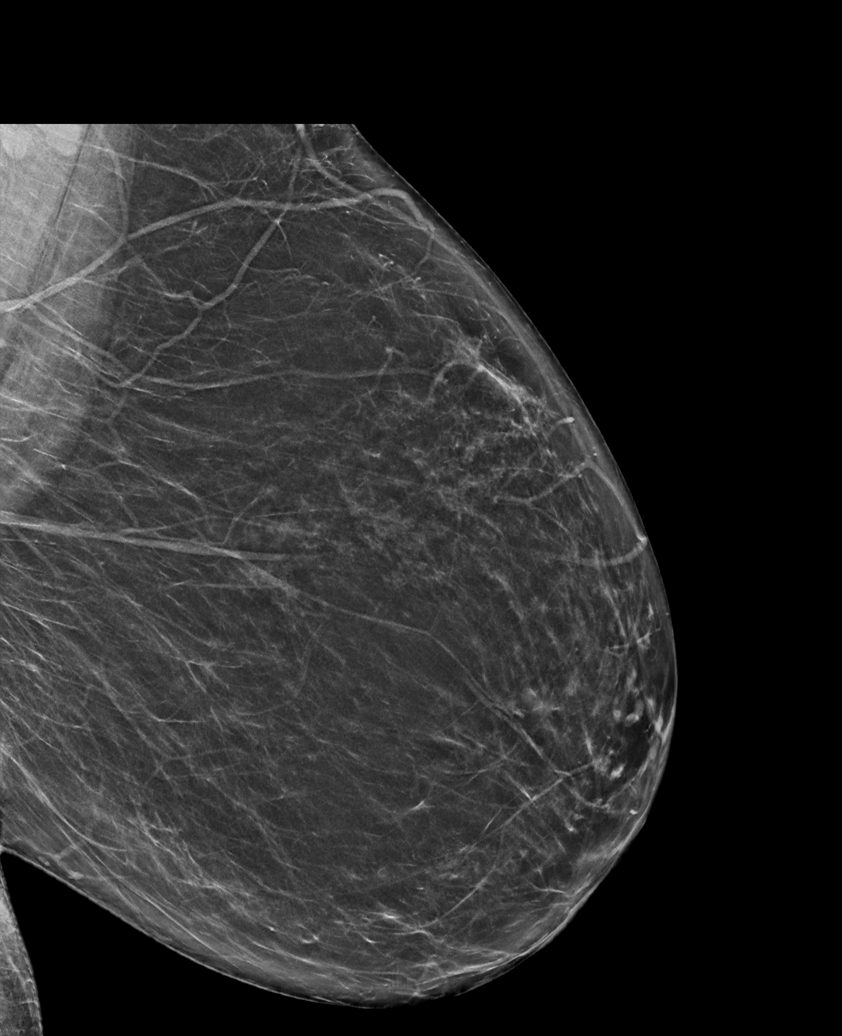

[L CC synth-2D]
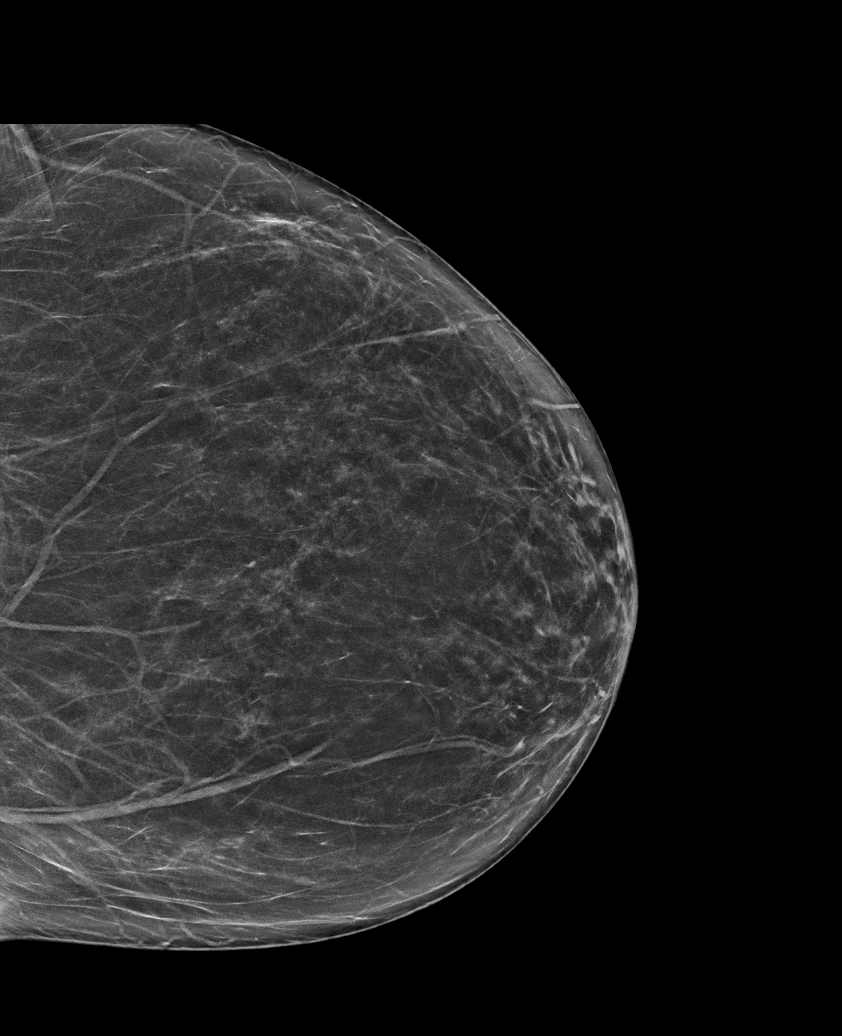

[R MLO synth-2D (1 of 2)]
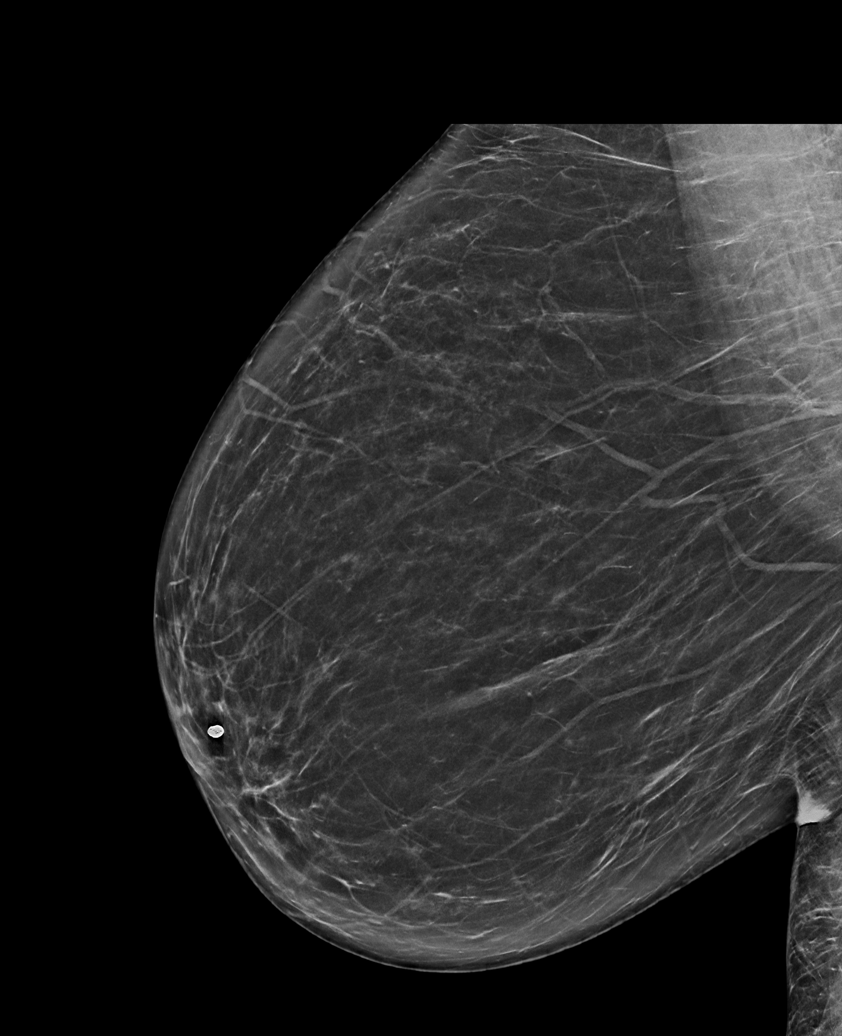

[R MLO synth-2D (2 of 2)]
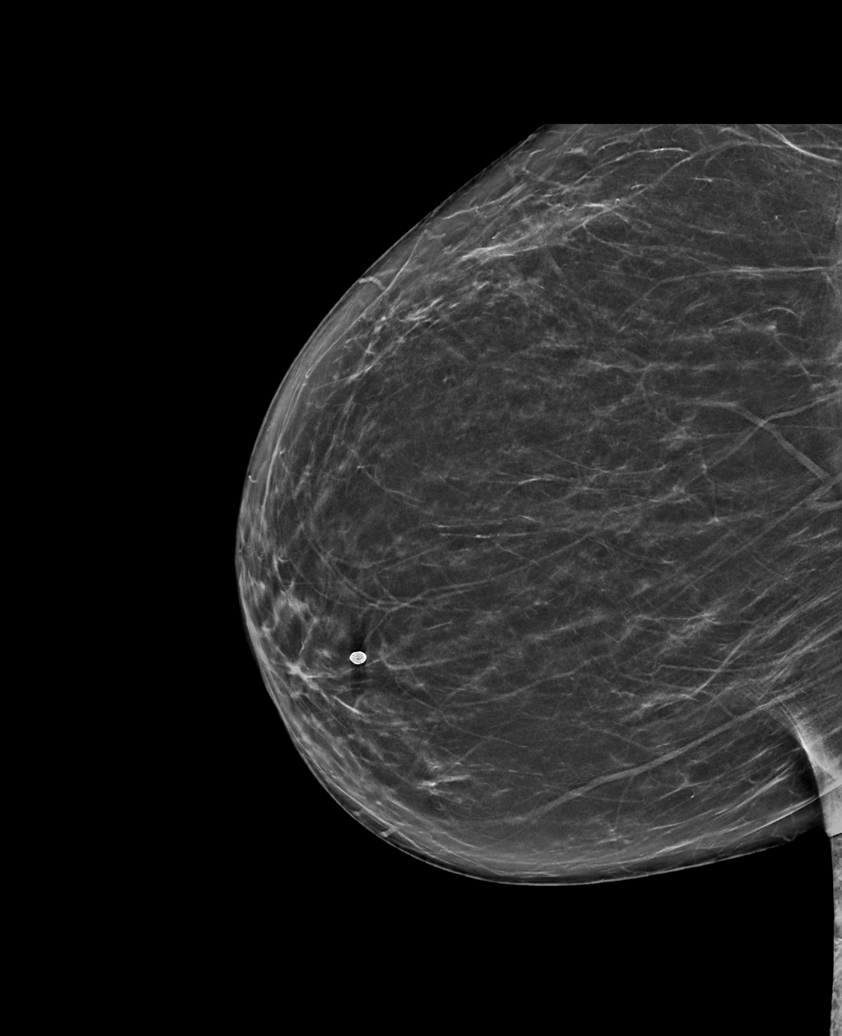

[R CC synth-2D]
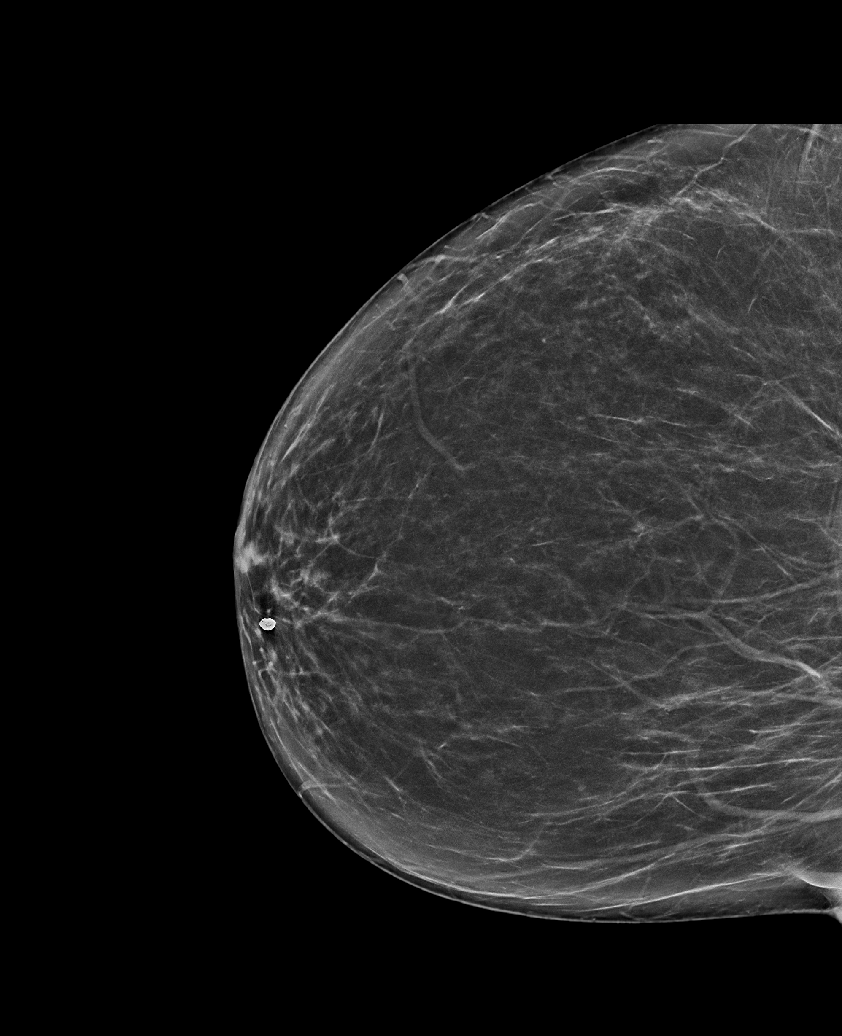

[R CC tomo · tomo slice 36/71.0]
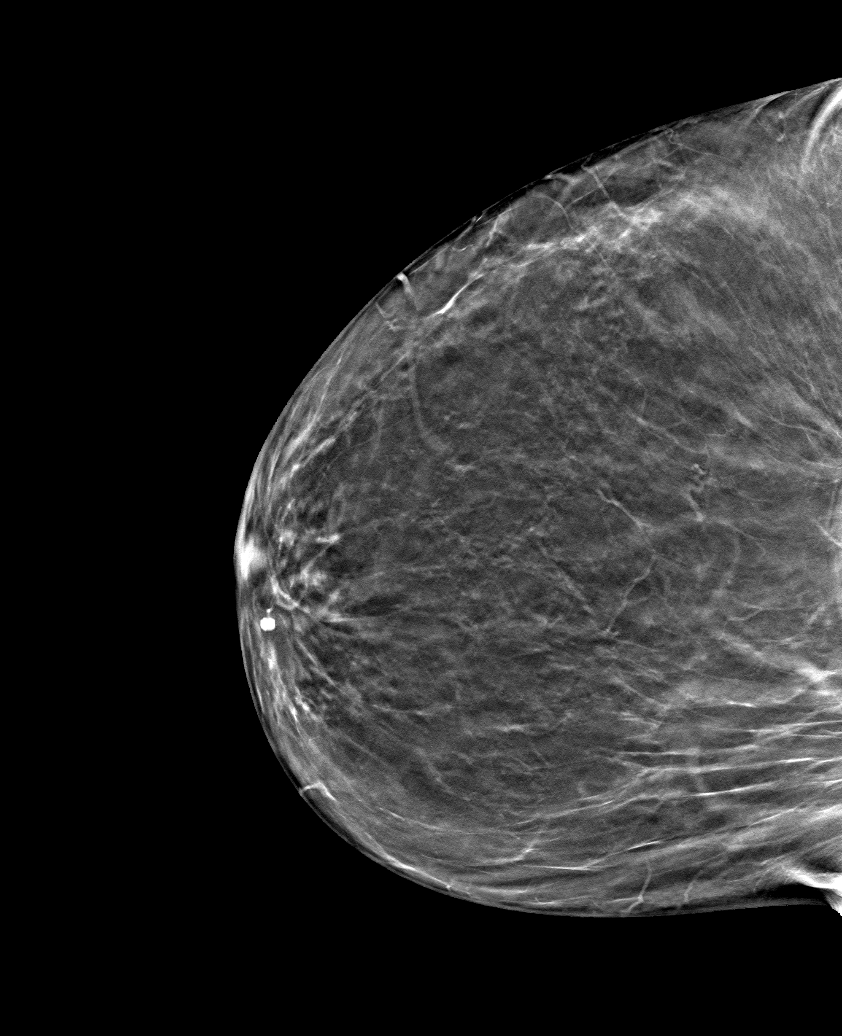

[6 of 30 positions shown; findings below may reference images not displayed]

ACR Breast Density Category b: There are scattered areas of
fibroglandular density.
FINDINGS: There are no findings suspicious for malignancy.
IMPRESSION: No mammographic evidence of malignancy. A result letter of this
screening mammogram will be mailed directly to the patient.

RECOMMENDATION:
Screening mammogram in one year. (Code:51-O-LD2)

BI-RADS CATEGORY  1: Negative.

## 2023-08-12 ENCOUNTER — Encounter (HOSPITAL_BASED_OUTPATIENT_CLINIC_OR_DEPARTMENT_OTHER): Payer: Self-pay

## 2023-08-12 ENCOUNTER — Emergency Department (HOSPITAL_BASED_OUTPATIENT_CLINIC_OR_DEPARTMENT_OTHER)

## 2023-08-12 ENCOUNTER — Other Ambulatory Visit: Payer: Self-pay

## 2023-08-12 ENCOUNTER — Emergency Department (HOSPITAL_BASED_OUTPATIENT_CLINIC_OR_DEPARTMENT_OTHER)
Admission: EM | Admit: 2023-08-12 | Discharge: 2023-08-12 | Discharge status: Against Medical Advice | Attending: Emergency Medicine | Admitting: Emergency Medicine

## 2023-08-12 DIAGNOSIS — R519 Headache, unspecified: Secondary | ICD-10-CM | POA: Diagnosis present

## 2023-08-12 DIAGNOSIS — Z5321 Procedure and treatment not carried out due to patient leaving prior to being seen by health care provider: Secondary | ICD-10-CM | POA: Diagnosis not present

## 2023-08-12 NOTE — ED Triage Notes (Signed)
 Pt states that she has had a headache x 3 days. Pt describes pain as a pressure on top of her head. Denies nausea. Denies visual disturbances.

## 2023-10-01 ENCOUNTER — Encounter (HOSPITAL_BASED_OUTPATIENT_CLINIC_OR_DEPARTMENT_OTHER): Payer: Self-pay | Admitting: Emergency Medicine

## 2023-10-01 ENCOUNTER — Emergency Department (HOSPITAL_BASED_OUTPATIENT_CLINIC_OR_DEPARTMENT_OTHER)
Admission: EM | Admit: 2023-10-01 | Discharge: 2023-10-01 | Disposition: A | Discharge status: Home / Self Care | Attending: Emergency Medicine | Admitting: Emergency Medicine

## 2023-10-01 ENCOUNTER — Emergency Department (HOSPITAL_BASED_OUTPATIENT_CLINIC_OR_DEPARTMENT_OTHER)

## 2023-10-01 ENCOUNTER — Other Ambulatory Visit: Payer: Self-pay

## 2023-10-01 DIAGNOSIS — Y92410 Unspecified street and highway as the place of occurrence of the external cause: Secondary | ICD-10-CM | POA: Diagnosis not present

## 2023-10-01 DIAGNOSIS — S29001A Unspecified injury of muscle and tendon of front wall of thorax, initial encounter: Secondary | ICD-10-CM | POA: Diagnosis not present

## 2023-10-01 DIAGNOSIS — Z7982 Long term (current) use of aspirin: Secondary | ICD-10-CM | POA: Insufficient documentation

## 2023-10-01 DIAGNOSIS — S298XXA Other specified injuries of thorax, initial encounter: Secondary | ICD-10-CM

## 2023-10-01 DIAGNOSIS — M546 Pain in thoracic spine: Secondary | ICD-10-CM | POA: Diagnosis not present

## 2023-10-01 DIAGNOSIS — S3991XA Unspecified injury of abdomen, initial encounter: Secondary | ICD-10-CM | POA: Diagnosis present

## 2023-10-01 LAB — CBC WITH DIFFERENTIAL/PLATELET
Abs Immature Granulocytes: 0.01 K/uL (ref 0.00–0.07)
Basophils Absolute: 0 K/uL (ref 0.0–0.1)
Basophils Relative: 0 %
Eosinophils Absolute: 0.1 K/uL (ref 0.0–0.5)
Eosinophils Relative: 2 %
HCT: 37.2 % (ref 36.0–46.0)
Hemoglobin: 12.1 g/dL (ref 12.0–15.0)
Immature Granulocytes: 0 %
Lymphocytes Relative: 46 %
Lymphs Abs: 1.9 K/uL (ref 0.7–4.0)
MCH: 27.8 pg (ref 26.0–34.0)
MCHC: 32.5 g/dL (ref 30.0–36.0)
MCV: 85.3 fL (ref 80.0–100.0)
Monocytes Absolute: 0.3 K/uL (ref 0.1–1.0)
Monocytes Relative: 8 %
Neutro Abs: 1.8 K/uL (ref 1.7–7.7)
Neutrophils Relative %: 44 %
Platelets: 199 K/uL (ref 150–400)
RBC: 4.36 MIL/uL (ref 3.87–5.11)
RDW: 14.4 % (ref 11.5–15.5)
WBC: 4.2 K/uL (ref 4.0–10.5)
nRBC: 0 % (ref 0.0–0.2)

## 2023-10-01 LAB — COMPREHENSIVE METABOLIC PANEL WITH GFR
ALT: 29 U/L (ref 0–44)
AST: 29 U/L (ref 15–41)
Albumin: 4.3 g/dL (ref 3.5–5.0)
Alkaline Phosphatase: 68 U/L (ref 38–126)
Anion gap: 10 (ref 5–15)
BUN: 7 mg/dL (ref 6–20)
CO2: 25 mmol/L (ref 22–32)
Calcium: 9.1 mg/dL (ref 8.9–10.3)
Chloride: 106 mmol/L (ref 98–111)
Creatinine, Ser: 0.63 mg/dL (ref 0.44–1.00)
GFR, Estimated: >60 mL/min (ref >60)
Glucose, Bld: 97 mg/dL (ref 70–99)
Potassium: 3.8 mmol/L (ref 3.5–5.1)
Sodium: 141 mmol/L (ref 135–145)
Total Bilirubin: 0.2 mg/dL (ref 0.0–1.2)
Total Protein: 6.5 g/dL (ref 6.5–8.1)

## 2023-10-01 LAB — LIPASE, BLOOD: Lipase: 60 U/L — ABNORMAL HIGH (ref 11–51)

## 2023-10-01 LAB — HCG, SERUM, QUALITATIVE: Preg, Serum: NEGATIVE

## 2023-10-01 MED ORDER — IOHEXOL 300 MG/ML  SOLN
125.0000 mL | Freq: Once | INTRAMUSCULAR | Status: AC | PRN
  Administered 2023-10-01: 100 mL via INTRAVENOUS

## 2023-10-01 MED ORDER — IBUPROFEN 600 MG PO TABS: 600.0000 mg | ORAL_TABLET | Freq: Four times a day (QID) | ORAL | 0 refills | Status: AC | PRN

## 2023-10-01 MED ORDER — METHOCARBAMOL 500 MG PO TABS: 500.0000 mg | ORAL_TABLET | Freq: Four times a day (QID) | ORAL | 0 refills | Status: AC
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
